# Patient Record
Sex: Female | Born: 2000 | Race: Black or African American | Hispanic: No | Marital: Single | Smoking: Never smoker
Health system: Southern US, Community
[De-identification: ages and names within clinical notes are randomized; demographics above are authoritative.]

## PROBLEM LIST (undated history)

## (undated) DIAGNOSIS — R55 Syncope and collapse: Secondary | ICD-10-CM

## (undated) DIAGNOSIS — F419 Anxiety disorder, unspecified: Secondary | ICD-10-CM

## (undated) DIAGNOSIS — F32A Depression, unspecified: Secondary | ICD-10-CM

## (undated) DIAGNOSIS — R011 Cardiac murmur, unspecified: Secondary | ICD-10-CM

## (undated) DIAGNOSIS — J45909 Unspecified asthma, uncomplicated: Secondary | ICD-10-CM

## (undated) DIAGNOSIS — D509 Iron deficiency anemia, unspecified: Secondary | ICD-10-CM

## (undated) DIAGNOSIS — R569 Unspecified convulsions: Secondary | ICD-10-CM

## (undated) DIAGNOSIS — J189 Pneumonia, unspecified organism: Secondary | ICD-10-CM

## (undated) HISTORY — DX: Iron deficiency anemia, unspecified: D50.9

## (undated) HISTORY — DX: Syncope and collapse: R55

## (undated) HISTORY — DX: Cardiac murmur, unspecified: R01.1

## (undated) HISTORY — DX: Unspecified convulsions: R56.9

## (undated) HISTORY — DX: Pneumonia, unspecified organism: J18.9

## (undated) HISTORY — DX: Unspecified asthma, uncomplicated: J45.909

## (undated) HISTORY — DX: Anxiety disorder, unspecified: F41.9

## (undated) HISTORY — DX: Depression, unspecified: F32.A

---
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
## 2020-03-01 ENCOUNTER — Other Ambulatory Visit: Payer: Self-pay

## 2020-03-01 ENCOUNTER — Ambulatory Visit (HOSPITAL_COMMUNITY)
Admission: EM | Admit: 2020-03-01 | Discharge: 2020-03-01 | Disposition: A | Discharge status: Home / Self Care | Payer: Medicaid Other | Attending: Psychiatry | Admitting: Psychiatry

## 2020-03-01 DIAGNOSIS — F4325 Adjustment disorder with mixed disturbance of emotions and conduct: Secondary | ICD-10-CM | POA: Insufficient documentation

## 2020-03-01 DIAGNOSIS — F4323 Adjustment disorder with mixed anxiety and depressed mood: Secondary | ICD-10-CM | POA: Insufficient documentation

## 2020-03-01 NOTE — ED Provider Notes (Signed)
Behavioral Health Urgent Care Medical Screening Exam  Patient Name: Kristin Ingram MRN: [id number] Date of Evaluation: 03/01/20 Chief Complaint:  Sent by [facility] for suicidal ideations Diagnosis:  Final diagnoses:  Adjustment disorder with mixed disturbance of emotions and conduct    History of Present illness: Kristin Ingram is a 19 y.o.  Kristin Ingram female patient seen in [facility] psych by this provider and chart reviewed on 03/01/20. Patient referred by [facility] after patient expressed suicidal ideations to counselor. On evaluation Kristin Ingram reports increased stress related to school demands and relationship issues. 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 Patient states she is in her first real relationship (same-sex) and finds it difficult to communicate with her partner. Patient uses female pronouns.   During evaluation Kristin Ingram is alert/oriented x 4; calm/cooperative; and mood is congruent with affect.  She does not appear to be responding to internal/external stimuli or delusional thoughts.  Patient denies suicidal/self-harm/homicidal ideation, psychosis, and paranoia.  Patient answered questions appropriately. Pt states she felt "embarrassed" as she didn't expect to be brought "to a place like this". Pt has endorses past depression, anxiety, and ADHD diagnoses; no past history of psychiatric hospitalizations. Patient currently lives at home with mother [name]) whom she states she doesn't have a close relationship with and no relationship with dad. Pt endorses supports as grandmother, friends, and therapists (2). Patient denies any anhedonia; states she hangs out with friends, plays video games, and spends time with family for leisure.  Pt provided verbal consent to call grandmother for collateral, states mother doesn't know about her  lifestyle (same-sex). Grandmother [name]) contacted via phone by provider. Patient's grandmother verbalized she doesn't "believe she will try to hurt herself" and sees herself as an active support person. Grandmother also states patient is very close to her youngest aunt who will also serve as a support person. Grandmother states she "feels she is alright and doesn't need to be in a hospital right now".   On re-evaluation patient verbalized feeling "embarrassed" by being brought in. Patient states "It's only stress, I'm not really going to jump off of a bridge. I know death is permanent and this temporary". Pt continues to deny suicidal or homicidal ideations. Patient denies any audio or visual hallucinations. Denies any illicit substance use. Patient states she sees a therapist once a week at [facility] [name] and "about every 2 weeks" at [employer]) for therapy and medication management. Patient unable to provide phone number due to not having her phone. Patient affect is brighter, pt is actively smiling and engaging with provider. "Ma'am I promise I'm not crazy and do not want to hurt myself. I was just frustrated and said some things but I do not need to be in a place like this. I'm in college and have plans".   Pt approached provider after stating she overheard "someone on the phone with my mom". Pt voiced concerns because her mom wasn't aware of her sexuality. Providers sat down with patient and reassured no information was discussed involving her sexuality. Providers reinforced the need to obtain collateral information for safety purposes only. After debriefing patient expressed appreciation and stating "I feel better now".   Psychiatric Specialty Exam  Presentation  General Appearance:Appropriate for Environment  Eye Contact:Good  Speech:Clear and Coherent  Speech Volume:Normal  Handedness:Right   Mood and Affect  Mood:Euphoric  Affect:Appropriate   Thought  Process  Thought Processes:Goal Directed  Descriptions of Associations:Intact  Orientation:Full (Time, Place and Person)  Thought Content:Logical;WDL  Hallucinations:None  Ideas of Reference:None  Suicidal Thoughts:No  Homicidal Thoughts:No   Sensorium  Memory:Immediate Good;Remote Good;Recent Good  Judgment:Good  Insight:Good   Executive Functions  Concentration:Good  Attention Span:Good  Recall:Good  Fund of Knowledge:Good  Language:Good   Psychomotor Activity  Psychomotor Activity:Normal   Assets  Assets:Communication Skills;Desire for Improvement;Housing;Leisure Time;Physical Health;Resilience;Social Support;Talents/Skills;Vocational/Educational   Sleep  Sleep:Good  Number of hours: 6   Physical Exam: Physical Exam Vitals and nursing note reviewed.  Psychiatric:        Mood and Affect: Mood normal.        Behavior: Behavior normal.        Thought Content: Thought content normal.        Judgment: Judgment normal.    Review of Systems  Psychiatric/Behavioral: Positive for depression.  All other systems reviewed and are negative.  Blood pressure 128/73, pulse 74, temperature 98.9 F (37.2 C), temperature source Oral, resp. rate 20, SpO2 100 %. There is no height or weight on file to calculate BMI.  Musculoskeletal: Strength & Muscle Tone: within normal limits Gait & Station: normal Patient leans: N/A   [facility] MSE Discharge Disposition for Follow up and Recommendations: Based on my evaluation the patient does not appear to have an emergency medical condition and can be discharged with resources and follow up care in outpatient services for Medication Management and Individual Therapy. Pt denies any active suicidal or homicidal ideations. Pt denies any auditory or visual hallucinations or illicit substance use. Pt has outpatient and family supports in place; currently being seen by [name] therapist [name]) and outpatient therapist  via [name], [name] ([address] [place]). Provider spoke with patient's grandmother [name] who states she does not believe patient is a threat to herself or others. Patient's grandmother confirmed patient is currently in active therapy and believes patient is safe for discharge.  Reviewed case with Dr. Nelly Rout. Patient current condition does not warrant inpatient hospitalization at this time.    Loletta Parish, NP 03/01/2020, 5:46 PM

## 2020-03-01 NOTE — Progress Notes (Signed)
[address] 

## 2020-03-01 NOTE — Progress Notes (Signed)
Kristin Ingram was given her AVS, questions answered and escorted to retrieve her personal belongings.

## 2020-03-01 NOTE — Discharge Instructions (Addendum)
Continue all medication as prescribed by your mental healthcare provider. Report any adverse effects and reactions from your medications to your outpatient provider promptly. Do not engage in alcohol and or illegal drug use while on prescription medicines.  Keep all scheduled appointments. This is to ensure that you are getting refills on time and to avoid any interruption in your medication. If you are unable to keep an appointment call and reschedule. Be sure to follow up with resources and follow ups given.  In the event of worsening symptoms call the crisis hotline 911, and or go to the nearest emergency department for appropriate evaluation and treatment of symptoms.  Follow-up with your primary care provider for your medical issues, concerns and or health care needs.

## 2020-03-01 NOTE — BH Assessment (Signed)
Comprehensive Clinical Assessment (CCA) Note  03/01/2020 Kristin Ingram [id number]   Patient states that she has become increasingly depressed and today she had suicidal thoughts.  Instead of acting on them, she states that she tried to contact her therapist at [facility], Leone Payor,, but was unable to connect with her so she went to the [facility] and states that they sent her here.  Patient states that she told them she was feeling suicidal and jumping off a bridge had crossed her mind. Patient states that she is an only child and she lives with her mother and states that they are not close.  Patient states that she is currently in a toxic relationship with a female who does not communicate with her and patient states that she has been codependent on the relationship.  She states that she knows that she needs to get out of it, but it is hard because this is her first love.  Patient states that she has never tried to kill herself in the past, but states that she has a history of cutting in middle school. However, patient states that she has not cut in many years.  Patient states that her sleep is off and she is only sleeping six hours on average per night and states that shee has not been eating very well recently. Patient denies any substance abuse.  Patient presents as oriented and alert.  Her eye contact was avoided.  Patient appears to be depressed and has a flat affect.  Her judgment, insight and impulse control appear to be mostly intact.  Her thoughts are organized and hermemory intact.  She does not appear to be responding to any internal stimuili.  TTS spoke to patient's mother, [name] [phone], who states that patient does not communicate with her and she states that she does not know anything that is going on in her life.  She states that patient isolates in her room and does not do much to help around the house.  Mother states that patient has cut  herself in the past, but states that she has never made any true suicide attempts in the past.  Mother states that she cannot offer any real suggestions that might help with her daughter because she does not know what is going on in her life.   Visit Diagnosis:      ICD-10-CM   1. Adjustment disorder with mixed disturbance of emotions and conduct  F43.25       CCA Screening, Triage and Referral (STR)  Patient Reported Information How did you hear about [place]? School/University  Referral name: [facility]  Referral phone number: No data recorded  Whom do you see for routine medical problems? Primary Care  Practice/Facility Name: [facility]  Practice/Facility Phone Number: No data recorded Name of Contact: No data recorded Contact Number: No data recorded Contact Fax Number: No data recorded Prescriber Name: No data recorded Prescriber Address (if known): No data recorded  What Is the Reason for Your Visit/Call Today? Patient is depressed, passive suicidal thoughts.  Patient states that she is in a toxic relationship that she knows that she needs to end, but states that she is too codependent  How Long Has This Been Causing You Problems? 1 wk - 1 month  What Do You Feel Would Help You the Most Today? Other (Comment) (more intensive therapy)   Have You Recently Been in Any Inpatient Treatment (Hospital/Detox/Crisis Center/28-Day Program)? No  Name/Location of Program/Hospital:No  data recorded How Long Were You There? No data recorded When Were You Discharged? No data recorded  Have You Ever Received Services From [facility] Before? No  Who Do You See at [facility]? No data recorded  Have You Recently Had Any Thoughts About Hurting Yourself? Yes  Are You Planning to Commit Suicide/Harm Yourself At This time? No   Have you Recently Had Thoughts About Hurting Someone Karolee Ohs? No  Explanation: No data recorded  Have You Used Any Alcohol or Drugs in  the Past 24 Hours? No  How Long Ago Did You Use Drugs or Alcohol? No data recorded What Did You Use and How Much? No data recorded  Do You Currently Have a Therapist/Psychiatrist? Yes  Name of Therapist/Psychiatrist: [facility].  Patient states that she sees a therapist and a psychiatrist at [facility].  She states that they were not available today so she went to the campus counseling center.   Have You Been Recently Discharged From Any Office Practice or Programs? No  Explanation of Discharge From Practice/Program: No data recorded    CCA Screening Triage Referral Assessment Type of Contact: Face-to-Face  Is this Initial or Reassessment? No data recorded Date Telepsych consult ordered in [facility]:  No data recorded Time Telepsych consult ordered in [facility]:  No data recorded  Patient Reported Information Reviewed? Yes  Patient Left Without Being Seen? No data recorded Reason for Not Completing Assessment: No data recorded  Collateral Involvement: No data recorded  Does Patient Have a Court Appointed Legal Guardian? No data recorded Name and Contact of Legal Guardian: No data recorded If Minor and Not Living with Parent(s), Who has Custody? No data recorded Is CPS involved or ever been involved? Never  Is APS involved or ever been involved? Never   Patient Determined To Be At Risk for Harm To Self or Others Based on Review of Patient Reported Information or Presenting Complaint? Yes, for Self-Harm (no plan or intent expressed)  Method: No data recorded Availability of Means: No data recorded Intent: No data recorded Notification Required: No data recorded Additional Information for Danger to Others Potential: No data recorded Additional Comments for Danger to Others Potential: No data recorded Are There Guns or Other Weapons in Your Home? No data recorded Types of Guns/Weapons: No data recorded Are These Weapons Safely Secured?                            No data recorded Who Could  Verify You Are Able To Have These Secured: No data recorded Do You Have any Outstanding Charges, Pending Court Dates, Parole/Probation? No data recorded Contacted To Inform of Risk of Harm To Self or Others: Family/Significant Other: (mother and grandmother aware)   Location of Assessment: [facility] Services   Does Patient Present under Involuntary Commitment? No  IVC Papers Initial File Date: No data recorded  [place] of Residence: [place]   Patient Currently Receiving the Following Services: Medication Management;Individual Therapy   Determination of Need: Urgent (48 hours)   Options For Referral: Inpatient Hospitalization;Medication Management;Outpatient Therapy     CCA Biopsychosocial  Intake/Chief Complaint:  CCA Intake With Chief Complaint CCA Part Two Date: 03/01/20 CCA Part Two Time: 1718 Chief Complaint/Presenting Problem: Patient states that she has become increasingly depressed and today she had suicidal thoughts.  Instead of acting on them, she states that she tried to contact her therapist at [facility], Leone Payor,, but was unable to connect with  her so she [facility] and states that they sent her here.  Patient states that she told them she was feeling suicidal and jumping off a bridge had crossed her mind. Patient states that she is an only child and she lives with her mother and states that they are not close.  Patient states that she is currently in a toxic relationship with a female who does not communicate with her and patient states that she has been codependent on the relationship.  She states that she knows that she needs to get out of it, but it is hard because this is her first love.  Patient states that she has never tried to kill herself in the past, but states that she has a history of cutting in middle school. However, patient states that she has not cut in many years.  Patient states that her sleep is off and she is  only sleeping six hours on average per night and states that shee has not been eating very well recently. Patient denies any substance abuse. Patient's Currently Reported Symptoms/Problems: Patient is experiencing a depressed mood, anxiety and has a flat affect. Individual's Strengths: Patient states that she is creative, intelligent, funny and easy going Individual's Preferences: patient has no preferences that require accommodation Individual's Abilities: Patient states that she is good at drawing Type of Services Patient Feels Are Needed: Patient states that she could benefit from more intensive services  Mental Health Symptoms Depression:     Mania:     Anxiety:      Psychosis:     Trauma:     Obsessions:     Compulsions:     Inattention:     Hyperactivity/Impulsivity:     Oppositional/Defiant Behaviors:     Emotional Irregularity:     Other Mood/Personality Symptoms:      Mental Status Exam Appearance and self-care  Stature:  Stature: Tall  Weight:  Weight: Thin  Clothing:  Clothing: Neat/clean  Grooming:  Grooming: Normal  Cosmetic use:  Cosmetic Use: Age appropriate  Posture/gait:  Posture/Gait: Normal  Motor activity:  Motor Activity: Restless  Sensorium  Attention:  Attention: Normal  Concentration:  Concentration: Normal  Orientation:  Orientation: Object, Person, Place, Situation, Time  Recall/memory:  Recall/Memory: Normal  Affect and Mood  Affect:  Affect: Depressed, Flat  Mood:  Mood: Depressed, Anxious  Relating  Eye contact:  Eye Contact: Avoided  Facial expression:  Facial Expression: Depressed, Anxious  Attitude toward examiner:  Attitude Toward Examiner: Cooperative  Thought and Language  Speech flow: Speech Flow: Clear and Coherent, Normal  Thought content:  Thought Content: Appropriate to Mood and Circumstances  Preoccupation:  Preoccupations: None  Hallucinations:  Hallucinations: None  Organization:     Company secretaryxecutive Functions  Fund of Knowledge:   Fund of Knowledge: Good  Intelligence:  Intelligence: Above Average  Abstraction:  Abstraction: Normal  Judgement:  Judgement: Fair  Dance movement psychotherapisteality Testing:  Reality Testing: Realistic  Insight:  Insight: Fair  Decision Making:  Decision Making: Impulsive  Social Functioning  Social Maturity:  Social Maturity: Isolates  Social Judgement:  Social Judgement: Normal  Stress  Stressors:  Stressors: Family conflict, Relationship, School  Coping Ability:  Coping Ability: Building surveyorverwhelmed  Skill Deficits:  Skill Deficits: None  Supports:  Supports: Family     Religion: Religion/Spirituality Are You A Religious Person?: No  Leisure/Recreation: Leisure / Recreation Do You Have Hobbies?: Yes Leisure and Hobbies: video games and spending time with friends  Exercise/Diet: Exercise/Diet Do You Exercise?: No Have You Gained or Lost A Significant Amount of Weight in the Past Six Months?: No Do You Follow a Special Diet?: No Do You Have Any Trouble Sleeping?: Yes Explanation of Sleeping Difficulties: wakes up a lot, sleeps six hours on average   CCA Employment/Education  Employment/Work Situation: Employment / Work Situation Employment situation: Surveyor, minerals job has been impacted by current illness: No What is the longest time patient has a held a job?: N/A Where was the patient employed at that time?: N/A Has patient ever been in the [employer]?: No  Education: Education Is Patient Currently Attending School?: Yes School Currently Attending: [employer] Last Grade Completed: 12 Name of [employer] School: [place] Did Garment/textile technologist From [employer]?: Yes Did Theme park manager?: Yes What Type of College Degree Do you Have?: Pursuing a nursing degree Did You Attend Graduate School?: No Did You Have An Individualized Education Program (IIEP): No Did You Have Any Difficulty At School?: No Patient's Education Has Been Impacted by Current Illness: No   CCA Family/Childhood History  Family and  Relationship History: Family history Marital status: Single Are you sexually active?: Yes What is your sexual orientation?: homosexual Has your sexual activity been affected by drugs, alcohol, medication, or emotional stress?: none reported Does patient have children?: No  Childhood History:  Childhood History By whom was/is the patient raised?: Mother Additional childhood history information: patient states that she has no relationship with her father and that he lives in [place] Description of patient's relationship with caregiver when they were a child: Patient states that she knows her mother loves her, but they do not communicate well Patient's description of current relationship with people who raised him/her: Patient states that she isolates herself with her mother How were you disciplined when you got in trouble as a child/adolescent?: Patient states thats he has been disciplined appropriately Does patient have siblings?: No Did patient suffer any verbal/emotional/physical/sexual abuse as a child?: No Did patient suffer from severe childhood neglect?: No Has patient ever been sexually abused/assaulted/raped as an adolescent or adult?: No Witnessed domestic violence?: No Has patient been affected by domestic violence as an adult?: No  Child/Adolescent Assessment:     CCA Substance Use  Alcohol/Drug Use:                           ASAM's:  Six Dimensions of Multidimensional Assessment  Dimension 1:  Acute Intoxication and/or Withdrawal Potential:      Dimension 2:  Biomedical Conditions and Complications:      Dimension 3:  Emotional, Behavioral, or Cognitive Conditions and Complications:     Dimension 4:  Readiness to Change:     Dimension 5:  Relapse, Continued use, or Continued Problem Potential:     Dimension 6:  Recovery/Living Environment:     ASAM Severity Score:    ASAM Recommended Level of Treatment:     Substance use Disorder (SUD)     Recommendations for Services/Supports/Treatments:    DSM5 Diagnoses: Patient Active Problem List   Diagnosis Date Noted  . Adjustment disorder with mixed anxiety and depressed mood   . Migraine without aura and without status migrainosus, not intractable 12/26/2015  . Tension headache 12/26/2015  . Vasovagal syncope 12/26/2015    Disposition:  Per Berneice Heinrich, NP, patient can be discharged home to follow-up with her OP [facility]   Referrals to Alternative Service(s): Referred to Alternative Service(s):   Place:  Date:   Time:    Referred to Alternative Service(s):   Place:   Date:   Time:    Referred to Alternative Service(s):   Place:   Date:   Time:    Referred to Alternative Service(s):   Place:   Date:   Time:     Shyler Holzman J Rockford Leinen

## 2021-03-23 ENCOUNTER — Emergency Department (HOSPITAL_COMMUNITY)
Admission: EM | Admit: 2021-03-23 | Discharge: 2021-03-24 | Disposition: A | Discharge status: Home / Self Care | Payer: Medicaid Other | Attending: Emergency Medicine | Admitting: Emergency Medicine

## 2021-03-23 ENCOUNTER — Other Ambulatory Visit: Payer: Self-pay

## 2021-03-23 ENCOUNTER — Encounter (HOSPITAL_COMMUNITY): Payer: Self-pay

## 2021-03-23 DIAGNOSIS — R1011 Right upper quadrant pain: Secondary | ICD-10-CM | POA: Diagnosis present

## 2021-03-23 DIAGNOSIS — Z20822 Contact with and (suspected) exposure to covid-19: Secondary | ICD-10-CM | POA: Diagnosis not present

## 2021-03-23 DIAGNOSIS — R109 Unspecified abdominal pain: Secondary | ICD-10-CM

## 2021-03-23 DIAGNOSIS — Z880 Allergy status to penicillin: Secondary | ICD-10-CM | POA: Diagnosis not present

## 2021-03-23 DIAGNOSIS — N9489 Other specified conditions associated with female genital organs and menstrual cycle: Secondary | ICD-10-CM | POA: Insufficient documentation

## 2021-03-23 NOTE — ED Triage Notes (Signed)
Pt BIB EMS from home. Pt complains of severe abdominal pain x 2 weeks to the RUQ.

## 2021-03-23 NOTE — ED Notes (Signed)
Labeled specimen cup provided to pt for urine collection per MD order. ENMiles 

## 2021-03-24 ENCOUNTER — Emergency Department (HOSPITAL_COMMUNITY)
Admission: EM | Admit: 2021-03-24 | Discharge: 2021-03-24 | Disposition: A | Discharge status: Home / Self Care | Payer: Medicaid Other | Source: Home / Self Care | Attending: Emergency Medicine | Admitting: Emergency Medicine

## 2021-03-24 ENCOUNTER — Emergency Department (HOSPITAL_COMMUNITY): Payer: Medicaid Other

## 2021-03-24 ENCOUNTER — Other Ambulatory Visit: Payer: Self-pay

## 2021-03-24 ENCOUNTER — Encounter (HOSPITAL_COMMUNITY): Payer: Self-pay | Admitting: Emergency Medicine

## 2021-03-24 DIAGNOSIS — R197 Diarrhea, unspecified: Secondary | ICD-10-CM | POA: Insufficient documentation

## 2021-03-24 DIAGNOSIS — R262 Difficulty in walking, not elsewhere classified: Secondary | ICD-10-CM

## 2021-03-24 DIAGNOSIS — R531 Weakness: Secondary | ICD-10-CM | POA: Insufficient documentation

## 2021-03-24 DIAGNOSIS — R1011 Right upper quadrant pain: Secondary | ICD-10-CM | POA: Insufficient documentation

## 2021-03-24 DIAGNOSIS — Z20822 Contact with and (suspected) exposure to covid-19: Secondary | ICD-10-CM | POA: Insufficient documentation

## 2021-03-24 LAB — CBC WITH DIFFERENTIAL/PLATELET
Abs Immature Granulocytes: 0.01 10*3/uL (ref 0.00–0.07)
Abs Immature Granulocytes: 0.01 10*3/uL (ref 0.00–0.07)
Basophils Absolute: 0 10*3/uL (ref 0.0–0.1)
Basophils Absolute: 0 10*3/uL (ref 0.0–0.1)
Basophils Relative: 1 %
Basophils Relative: 1 %
Eosinophils Absolute: 0 10*3/uL (ref 0.0–0.5)
Eosinophils Absolute: 0.1 10*3/uL (ref 0.0–0.5)
Eosinophils Relative: 0 %
Eosinophils Relative: 1 %
HCT: 34.1 % — ABNORMAL LOW (ref 36.0–46.0)
HCT: 35.1 % — ABNORMAL LOW (ref 36.0–46.0)
Hemoglobin: 11.7 g/dL — ABNORMAL LOW (ref 12.0–15.0)
Hemoglobin: 12 g/dL (ref 12.0–15.0)
Immature Granulocytes: 0 %
Immature Granulocytes: 0 %
Lymphocytes Relative: 41 %
Lymphocytes Relative: 56 %
Lymphs Abs: 2.3 10*3/uL (ref 0.7–4.0)
Lymphs Abs: 4 10*3/uL (ref 0.7–4.0)
MCH: 28.9 pg (ref 26.0–34.0)
MCH: 29.3 pg (ref 26.0–34.0)
MCHC: 34.2 g/dL (ref 30.0–36.0)
MCHC: 34.3 g/dL (ref 30.0–36.0)
MCV: 84.6 fL (ref 80.0–100.0)
MCV: 85.3 fL (ref 80.0–100.0)
Monocytes Absolute: 0.5 10*3/uL (ref 0.1–1.0)
Monocytes Absolute: 0.7 10*3/uL (ref 0.1–1.0)
Monocytes Relative: 11 %
Monocytes Relative: 9 %
Neutro Abs: 2.2 10*3/uL (ref 1.7–7.7)
Neutro Abs: 2.7 10*3/uL (ref 1.7–7.7)
Neutrophils Relative %: 31 %
Neutrophils Relative %: 49 %
Platelets: 236 10*3/uL (ref 150–400)
Platelets: 248 10*3/uL (ref 150–400)
RBC: 4 MIL/uL (ref 3.87–5.11)
RBC: 4.15 MIL/uL (ref 3.87–5.11)
RDW: 14.3 % (ref 11.5–15.5)
RDW: 14.3 % (ref 11.5–15.5)
WBC: 5.5 10*3/uL (ref 4.0–10.5)
WBC: 7 10*3/uL (ref 4.0–10.5)
nRBC: 0 % (ref 0.0–0.2)
nRBC: 0 % (ref 0.0–0.2)

## 2021-03-24 LAB — URINALYSIS, ROUTINE W REFLEX MICROSCOPIC
Bilirubin Urine: NEGATIVE
Glucose, UA: NEGATIVE mg/dL
Hgb urine dipstick: NEGATIVE
Ketones, ur: 20 mg/dL — AB
Leukocytes,Ua: NEGATIVE
Nitrite: NEGATIVE
Protein, ur: 30 mg/dL — AB
Specific Gravity, Urine: 1.026 (ref 1.005–1.030)
pH: 7 (ref 5.0–8.0)

## 2021-03-24 LAB — I-STAT BETA HCG BLOOD, ED (MC, WL, AP ONLY): I-stat hCG, quantitative: <5 m[IU]/mL (ref <5)

## 2021-03-24 LAB — COMPREHENSIVE METABOLIC PANEL
ALT: 12 U/L (ref 0–44)
ALT: 14 U/L (ref 0–44)
AST: 23 U/L (ref 15–41)
AST: 25 U/L (ref 15–41)
Albumin: 4.6 g/dL (ref 3.5–5.0)
Albumin: 4.8 g/dL (ref 3.5–5.0)
Alkaline Phosphatase: 38 U/L (ref 38–126)
Alkaline Phosphatase: 45 U/L (ref 38–126)
Anion gap: 11 (ref 5–15)
Anion gap: 12 (ref 5–15)
BUN: 12 mg/dL (ref 6–20)
BUN: 12 mg/dL (ref 6–20)
CO2: 21 mmol/L — ABNORMAL LOW (ref 22–32)
CO2: 22 mmol/L (ref 22–32)
Calcium: 9.6 mg/dL (ref 8.9–10.3)
Calcium: 9.9 mg/dL (ref 8.9–10.3)
Chloride: 103 mmol/L (ref 98–111)
Chloride: 107 mmol/L (ref 98–111)
Creatinine, Ser: 0.64 mg/dL (ref 0.44–1.00)
Creatinine, Ser: 0.89 mg/dL (ref 0.44–1.00)
GFR, Estimated: >60 mL/min (ref >60)
GFR, Estimated: >60 mL/min (ref >60)
Glucose, Bld: 85 mg/dL (ref 70–99)
Glucose, Bld: 91 mg/dL (ref 70–99)
Potassium: 3.3 mmol/L — ABNORMAL LOW (ref 3.5–5.1)
Potassium: 4.7 mmol/L (ref 3.5–5.1)
Sodium: 136 mmol/L (ref 135–145)
Sodium: 140 mmol/L (ref 135–145)
Total Bilirubin: 1.3 mg/dL — ABNORMAL HIGH (ref 0.3–1.2)
Total Bilirubin: 1.4 mg/dL — ABNORMAL HIGH (ref 0.3–1.2)
Total Protein: 7.7 g/dL (ref 6.5–8.1)
Total Protein: 8 g/dL (ref 6.5–8.1)

## 2021-03-24 LAB — LIPASE, BLOOD
Lipase: 26 U/L (ref 11–51)
Lipase: 29 U/L (ref 11–51)

## 2021-03-24 LAB — RESP PANEL BY RT-PCR (FLU A&B, COVID) ARPGX2
Influenza A by PCR: NEGATIVE
Influenza B by PCR: NEGATIVE
SARS Coronavirus 2 by RT PCR: NEGATIVE

## 2021-03-24 LAB — TROPONIN I (HIGH SENSITIVITY): Troponin I (High Sensitivity): 4 ng/L (ref <18)

## 2021-03-24 IMAGING — MR MR THORACIC SPINE WO/W CM
11 of 23 series · 19 of 48 positions shown · IV contrast (gadavist)
Comparison: None.

CLINICAL DATA: Initial evaluation for possible multiple sclerosis,
ataxia.

EXAM:
MRI THORACIC WITHOUT AND WITH CONTRAST
TECHNIQUE: Multiplanar and multiecho pulse sequences of the thoracic spine were
obtained without and with intravenous contrast.
CONTRAST:  6mL GADAVIST GADOBUTROL 1 MMOL/ML IV SOLN

[Series 16: T1 · sagittal · 5.0mm · 1.46mm/px · 1 of 9 slices shown (1 of 5)]
[im 1/9]
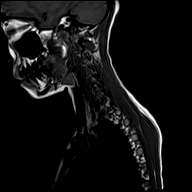

[Series 17: T1 · sagittal · 5.0mm · 1.23mm/px · 1 of 9 slices shown (2 of 5)]
[im 1/9]
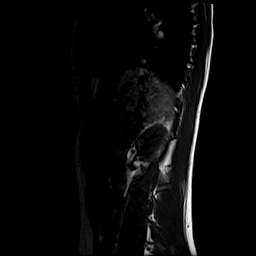

[Series 18: T1 · sagittal · 6.0mm · 1.23mm/px · 1 of 9 slices shown (3 of 5)]
[im 1/9]
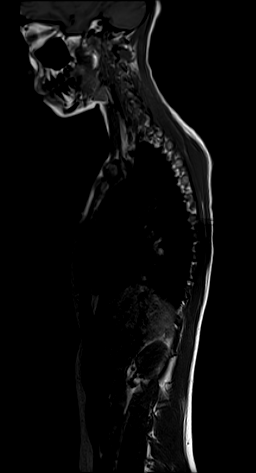

[Series 19: T2 · sagittal · 3.0mm · 0.78mm/px · 1 of 19 slices shown (1 of 2)]
[im 1/19]
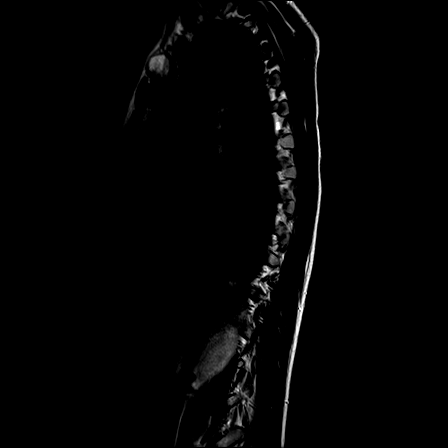

[Series 20: T1 · sagittal · 3.0mm · 0.78mm/px · 1 of 19 slices shown (4 of 5)]
[im 1/19]
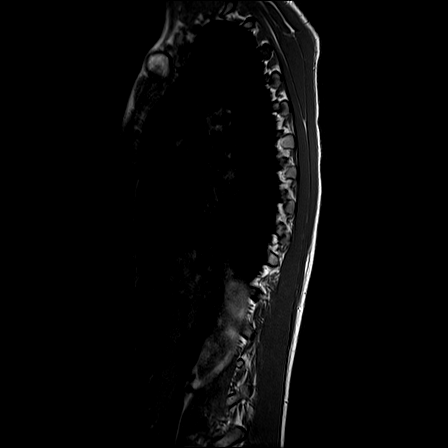

[Series 21: STIR · sagittal · 3.0mm · 0.39mm/px · 1 of 19 slices shown]
[im 1/19]
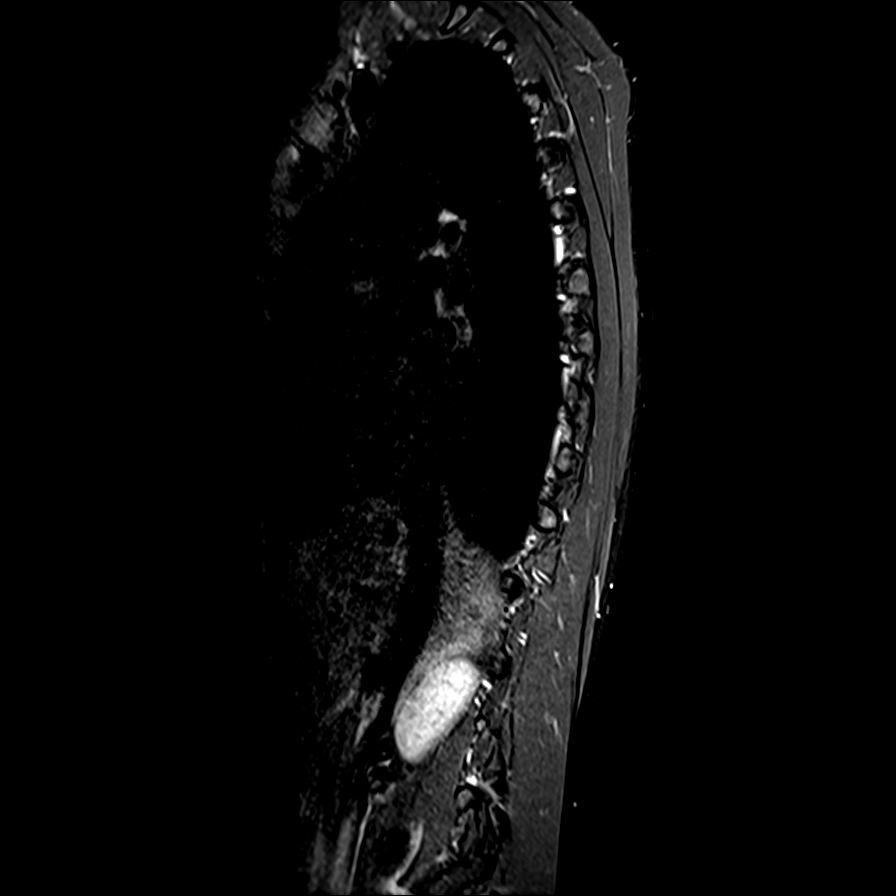

[Series 22: T2 · axial · 4.0mm · 0.59mm/px · z∈[-369,-60]mm · 3 of 52 slices shown (2 of 2)]
[im 1/52]
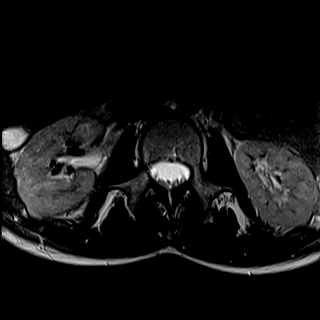
[im 26/52]
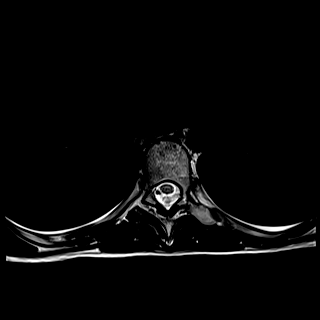
[im 52/52]
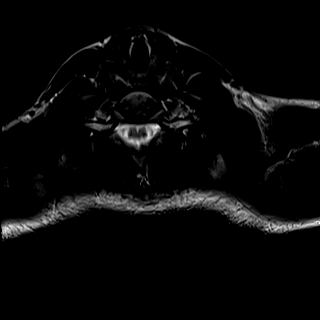

[Series 23: GRE · axial · 4.0mm · 0.37mm/px · z∈[-369,-60]mm · 3 of 52 slices shown]
[im 1/52]
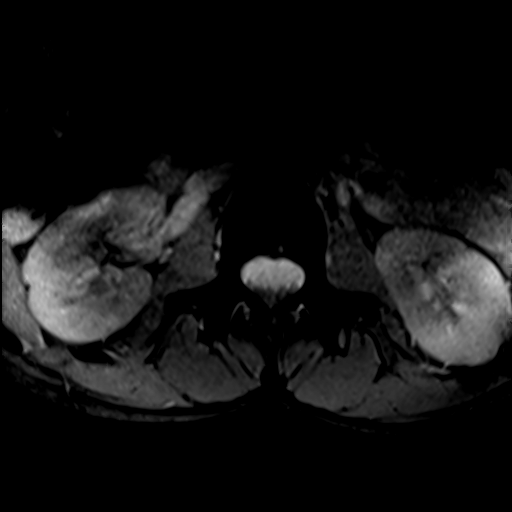
[im 26/52]
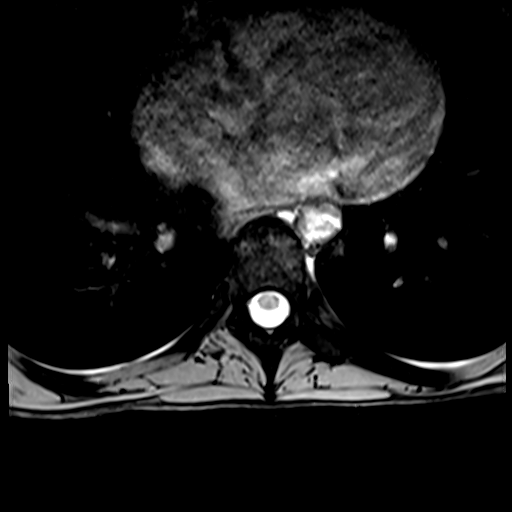
[im 52/52]
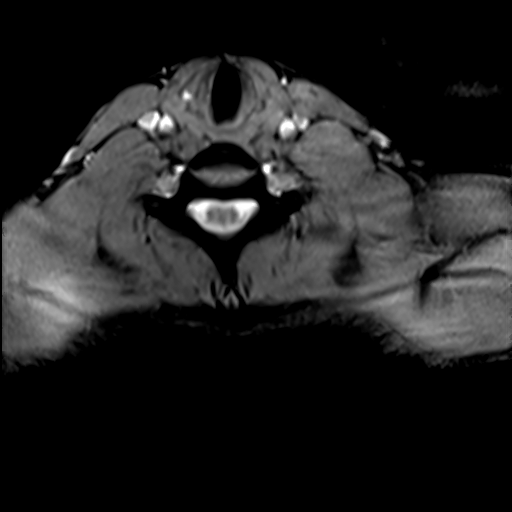

[Series 24: T1 · axial · non-contrast · 4.0mm · 0.31mm/px · z∈[-365,-60]mm · 3 of 52 slices shown (5 of 5)]
[im 1/52]
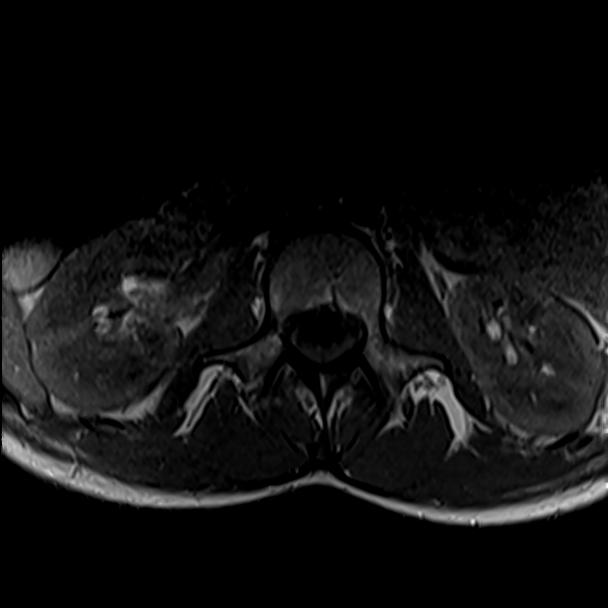
[im 26/52]
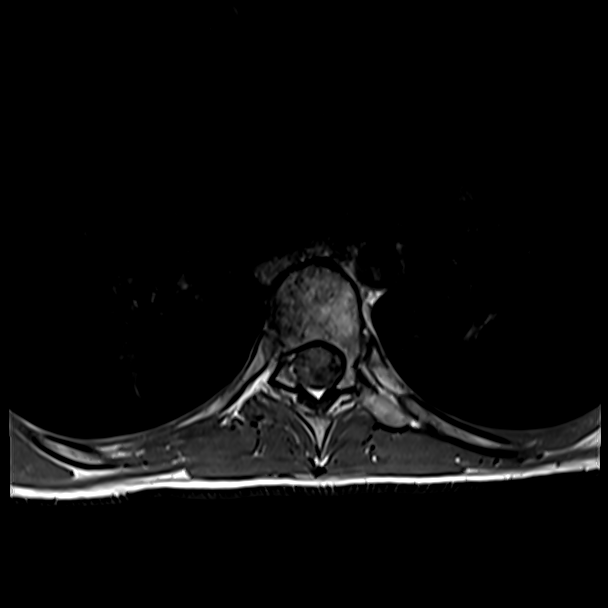
[im 52/52]
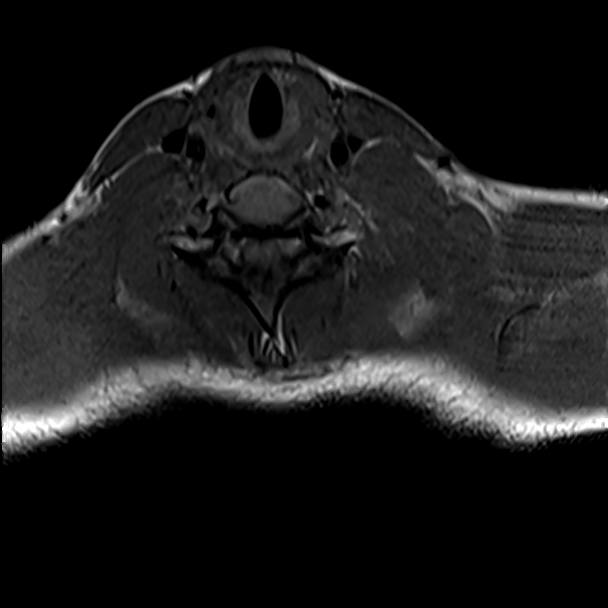

[Series 25: T1 post-contrast · axial · 4.0mm · 0.31mm/px · z∈[-369,-60]mm · 3 of 52 slices shown]
[im 1/52]
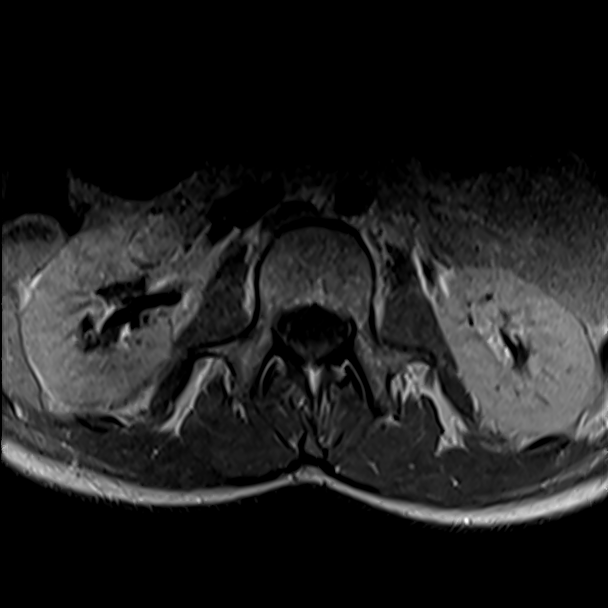
[im 26/52]
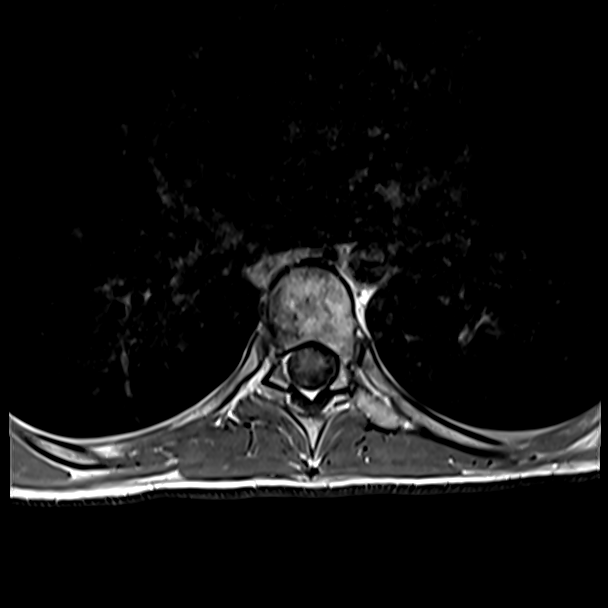
[im 52/52]
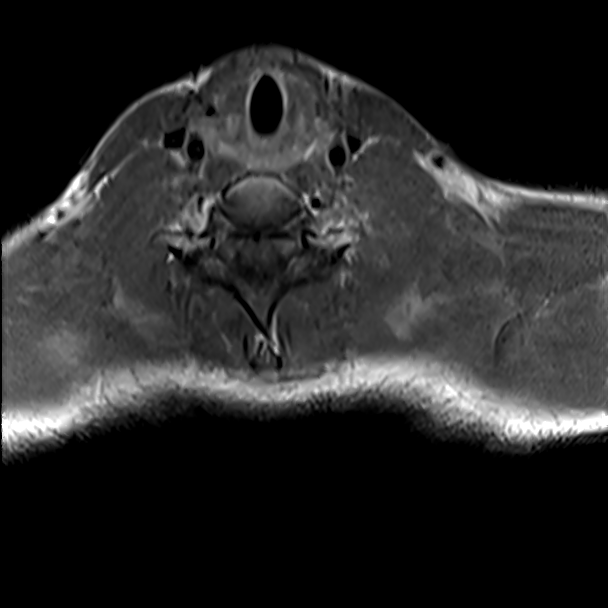

[Series 26: T1 fat-sat post-contrast · sagittal · 3.0mm · 0.76mm/px · 1 of 19 slices shown]
[im 1/19]
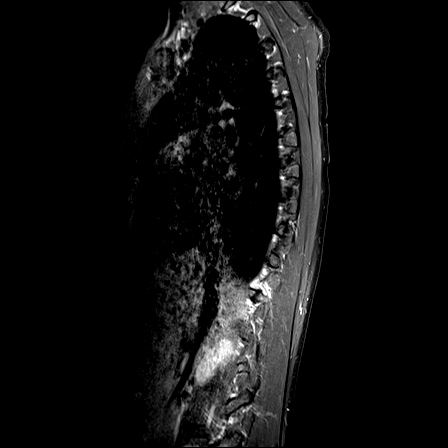

[19 of 48 positions shown; findings below may reference images not displayed]

FINDINGS: Alignment: Physiologic with preservation of the normal thoracic
kyphosis. No listhesis.

Vertebrae: Vertebral body height maintained without acute or chronic
fracture. Bone marrow signal intensity within normal limits. No
discrete or worrisome osseous lesions. No abnormal marrow edema or
enhancement.

Cord: Normal signal and morphology. No signal changes to suggest
demyelinating disease. No abnormal enhancement.

Paraspinal and other soft tissues: Unremarkable.

Disc levels:

T8-9: Disc desiccation with small right paracentral disc protrusion
minimally indents the ventral thecal sac (series 23, image 29). No
significant spinal stenosis.

No other significant disc pathology seen within the thoracic spine.
No other stenosis or neural impingement.
IMPRESSION: 1. Normal MRI appearance of the thoracic spinal cord. No evidence
for demyelinating disease.
2. Small right paracentral disc protrusion at T8-9 without stenosis.

## 2021-03-24 IMAGING — MR MR CERVICAL SPINE WO/W CM
14 of 21 series · 25 of 48 positions shown · IV contrast (gadavist)
Comparison: None.

CLINICAL DATA: Initial evaluation for possible multiple sclerosis,
new event.

EXAM:
MRI CERVICAL SPINE WITHOUT AND WITH CONTRAST
TECHNIQUE: Multiplanar and multiecho pulse sequences of the cervical spine, to
include the craniocervical junction and cervicothoracic junction,
were obtained without and with intravenous contrast.
CONTRAST:  6mL GADAVIST GADOBUTROL 1 MMOL/ML IV SOLN

[Series 9: T2 · sagittal · 3.0mm · 0.69mm/px · 1 of 15 slices shown (1 of 2)]
[im 1/15]
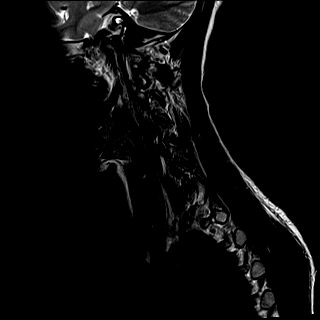

[Series 10: T1 · sagittal · 3.0mm · 0.69mm/px · 1 of 15 slices shown (1 of 8)]
[im 1/15]
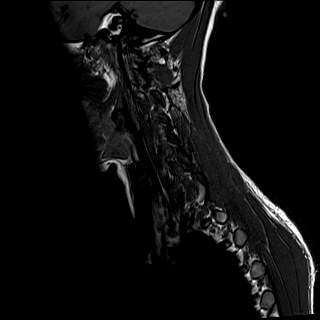

[Series 11: STIR · sagittal · 3.0mm · 0.86mm/px · 1 of 18 slices shown]
[im 1/18]
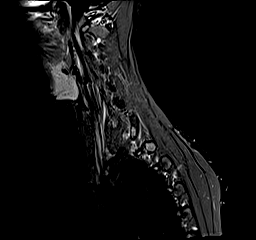

[Series 12: T2 · axial · 3.0mm · 0.66mm/px · z∈[-121,+6]mm · 4 of 43 slices shown (2 of 2)]
[im 1/43]
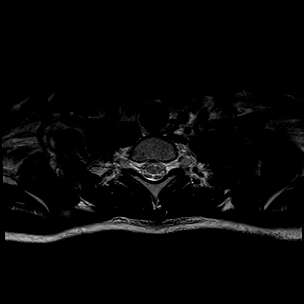
[im 15/43]
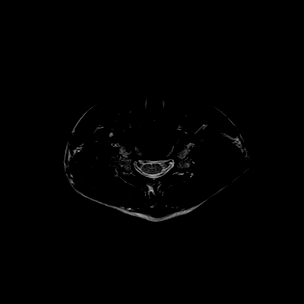
[im 29/43]
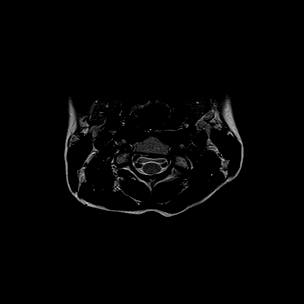
[im 43/43]
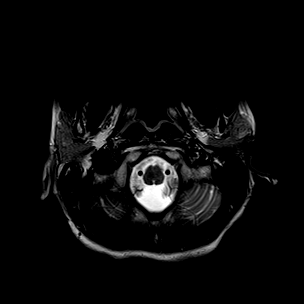

[Series 13: GRE · axial · 3.0mm · 0.39mm/px · z∈[-121,+6]mm · 4 of 43 slices shown]
[im 1/43]
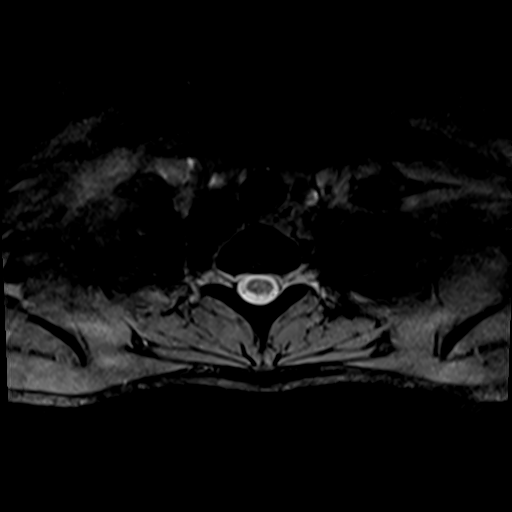
[im 15/43]
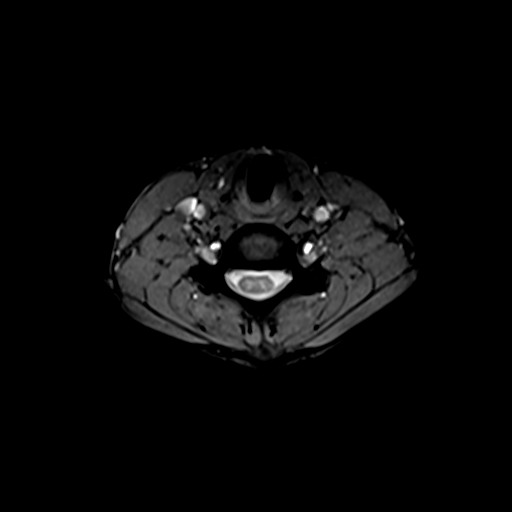
[im 29/43]
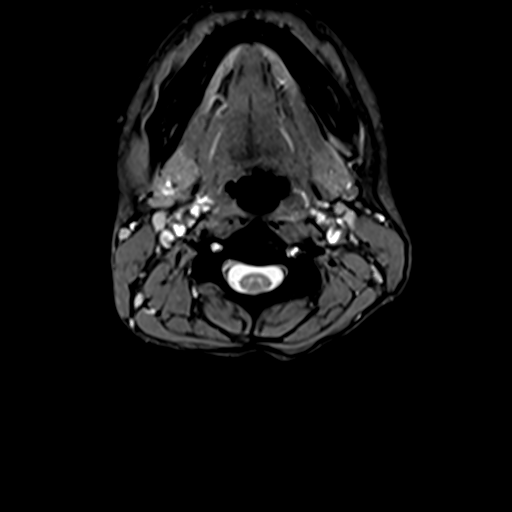
[im 43/43]
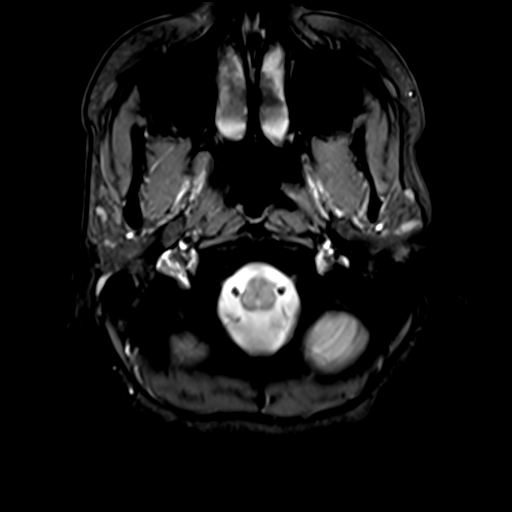

[Series 14: T1 · axial · 3.0mm · 0.39mm/px · z∈[-117,+13]mm · 4 of 43 slices shown (2 of 8)]
[im 1/43]
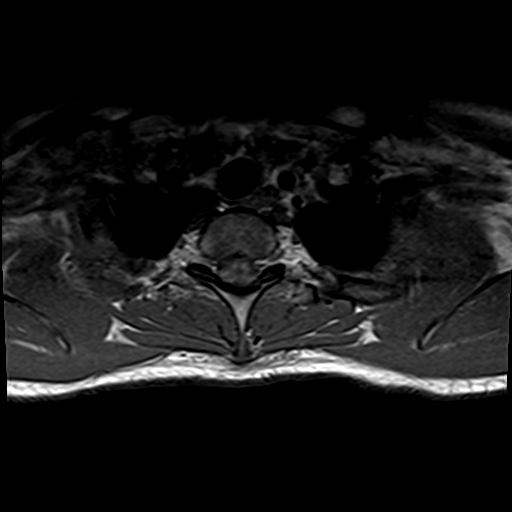
[im 15/43]
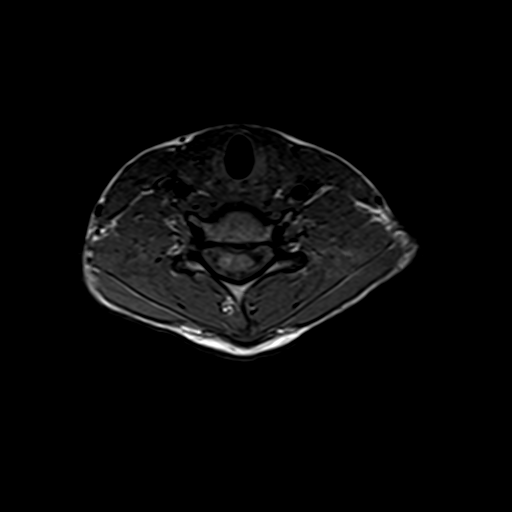
[im 29/43]
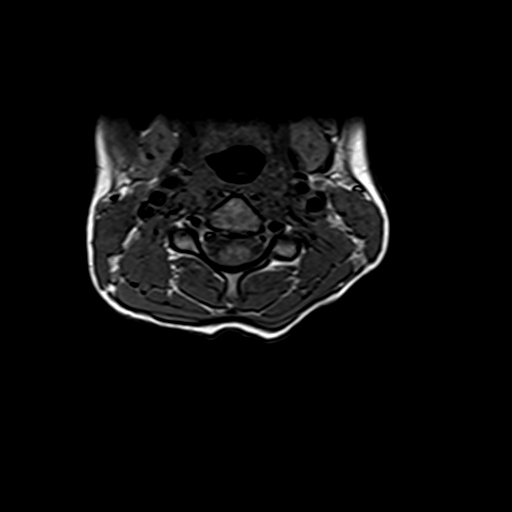
[im 43/43]
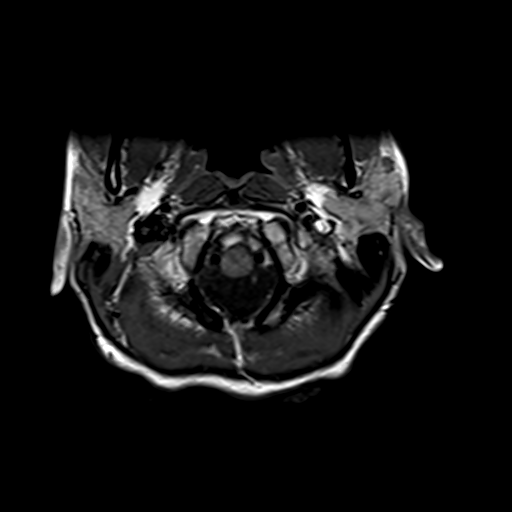

[Series 15: T1 · sagittal · 5.0mm · 1.46mm/px · 1 of 9 slices shown (3 of 8)]
[im 1/9]
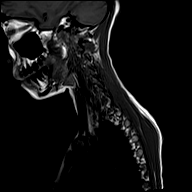

[Series 16: T1 · sagittal · 5.0mm · 1.23mm/px · 1 of 9 slices shown (4 of 8)]
[im 1/9]
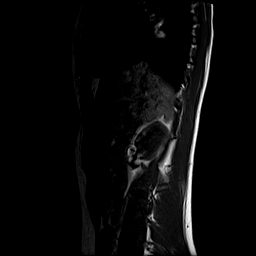

[Series 17: T1 · sagittal · 6.0mm · 1.23mm/px · 1 of 9 slices shown (5 of 8)]
[im 1/9]
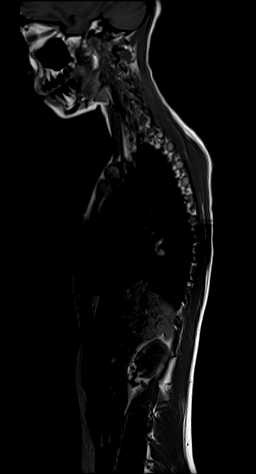

[Series 18: T1 · sagittal · 5.0mm · 1.46mm/px · 1 of 13 slices shown (6 of 8)]
[im 1/13]
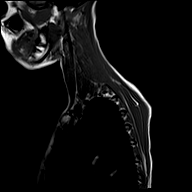

[Series 19: T1 · sagittal · 5.0mm · 1.29mm/px · 1 of 13 slices shown (7 of 8)]
[im 1/13]
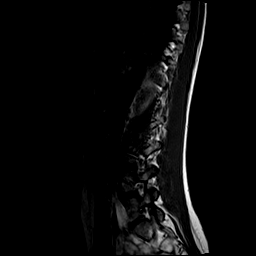

[Series 20: T1 · 1 of 9 slices shown (8 of 8)]
[im 1/9]
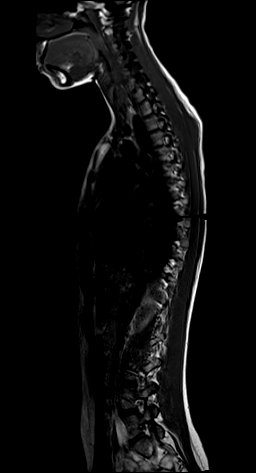

[Series 21: T1 fat-sat post-contrast · sagittal · 3.0mm · 0.43mm/px · 1 of 15 slices shown]
[im 1/15]
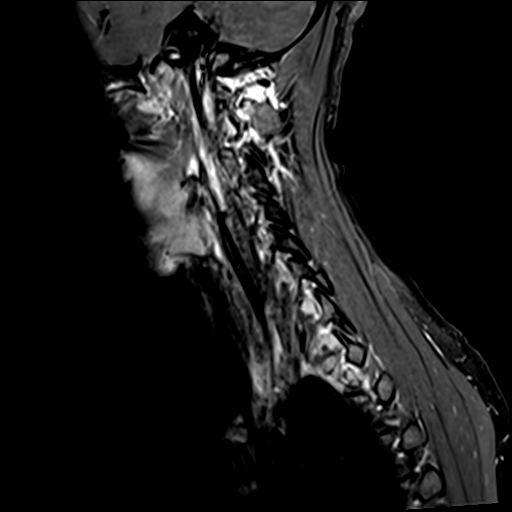

[Series 22: T1 post-contrast · axial · 3.0mm · 0.39mm/px · z∈[-117,+1]mm · 3 of 40 slices shown]
[im 1/40]
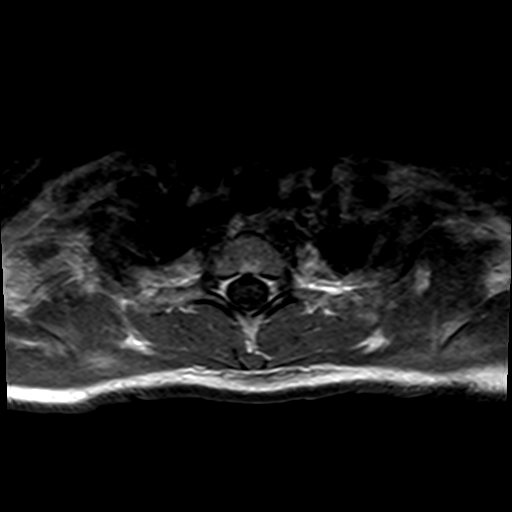
[im 20/40]
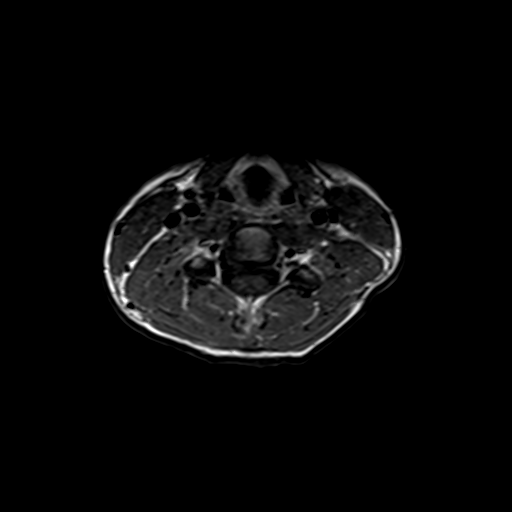
[im 40/40]
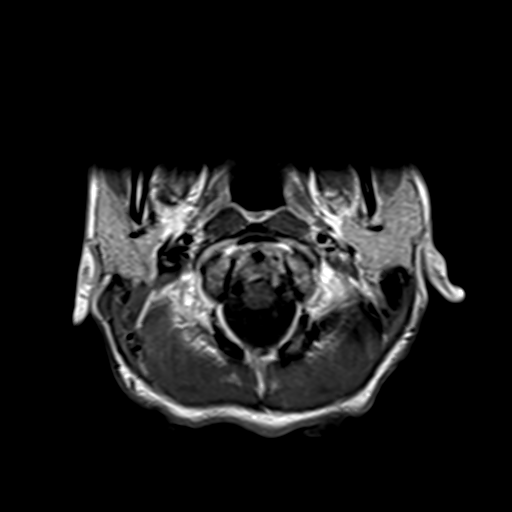

[25 of 48 positions shown; findings below may reference images not displayed]

FINDINGS: Alignment: Straightening of the normal cervical lordosis. No
listhesis.

Vertebrae: Vertebral body height maintained without acute or chronic
fracture. Bone marrow signal intensity within normal limits. No
discrete or worrisome osseous lesions. No abnormal marrow edema or
enhancement.

Cord: Normal signal morphology. No findings to suggest demyelinating
disease. No abnormal enhancement. Normal cord caliber and
morphology.

Posterior Fossa, vertebral arteries, paraspinal tissues:
Unremarkable.

Disc levels:

Unremarkable. No significant disc pathology or facet degeneration.
No canal or neural foraminal stenosis or evidence for neural
impingement.
IMPRESSION: Normal MRI of the cervical spine and spinal cord. No evidence for
demyelinating disease or other abnormality.

## 2021-03-24 IMAGING — MR MR HEAD WO/W CM
14 of 20 series · 33 of 48 positions shown · IV contrast (gadavist)
Comparison: None.

CLINICAL DATA: Initial evaluation for possible new onset and mass,
family history of a mass. Ataxia with bilateral leg weakness.

EXAM:
MRI HEAD WITHOUT AND WITH CONTRAST
TECHNIQUE: Multiplanar, multiecho pulse sequences of the brain and surrounding
structures were obtained without and with intravenous contrast.
CONTRAST:  6mL GADAVIST GADOBUTROL 1 MMOL/ML IV SOLN

[Series 5: DWI · axial · 3.0mm · 0.88mm/px · z∈[-127,+2]mm · 4 of 100 slices shown (1 of 4)]
[im 1/100]
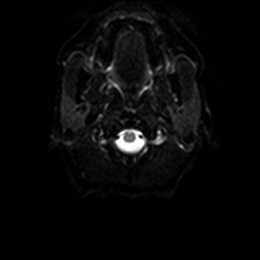
[im 34/100]
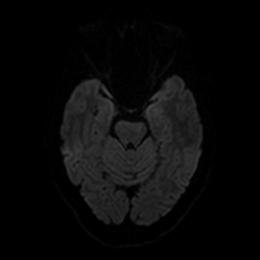
[im 67/100]
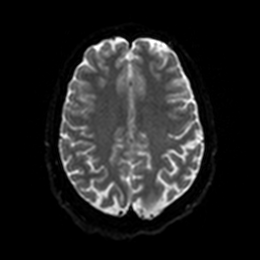
[im 100/100]
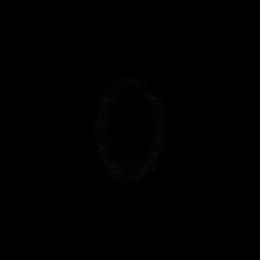

[Series 6: DWI · axial · 3.0mm · 0.88mm/px · z∈[-127,+2]mm · 2 of 50 slices shown (2 of 4)]
[im 1/50]
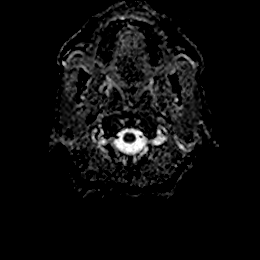
[im 50/50]
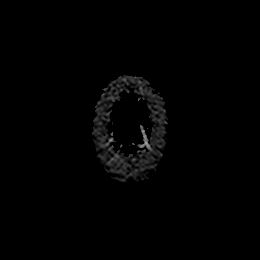

[Series 7: DWI · coronal · 4.0mm · 0.88mm/px · 3 of 70 slices shown (3 of 4)]
[im 1/70]
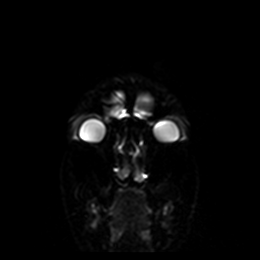
[im 35/70]
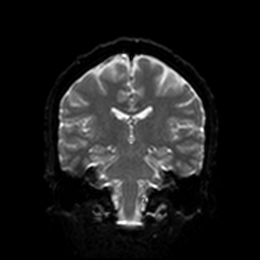
[im 70/70]
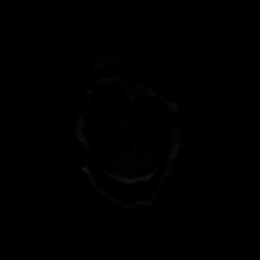

[Series 8: DWI · coronal · 4.0mm · 0.88mm/px · 2 of 35 slices shown (4 of 4)]
[im 1/35]
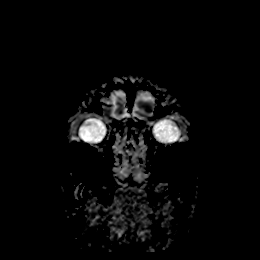
[im 35/35]
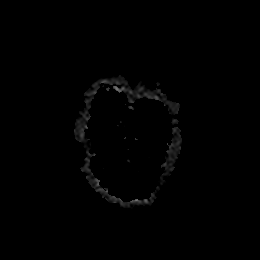

[Series 9: T1 · sagittal · 5.0mm · 0.75mm/px · 1 of 25 slices shown]
[im 1/25]
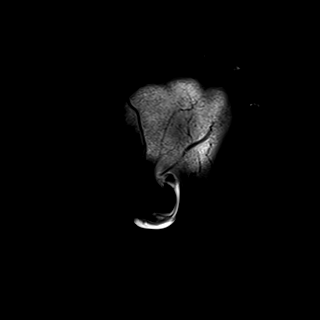

[Series 10: T2 · axial · 5.0mm · 0.72mm/px · z∈[-140,+3]mm · 2 of 28 slices shown]
[im 1/28]
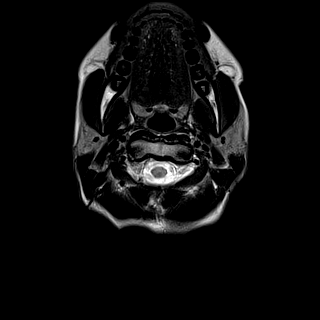
[im 28/28]
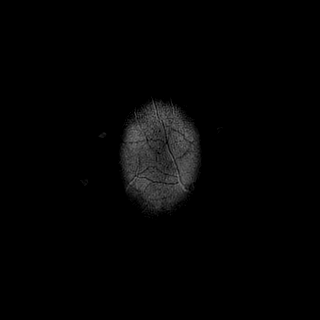

[Series 11: FLAIR · axial · 5.0mm · 0.45mm/px · z∈[-140,+3]mm · 2 of 28 slices shown]
[im 1/28]
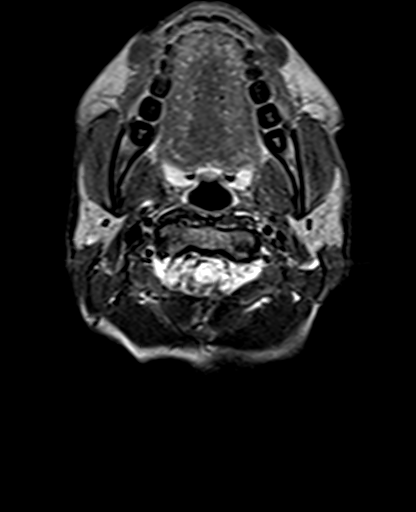
[im 28/28]
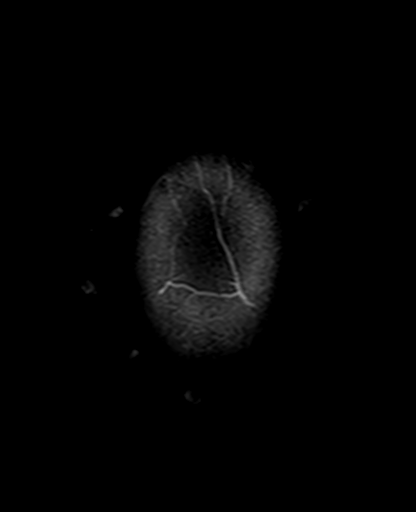

[Series 12: mag_images · axial · 3.0mm · 0.90mm/px · z∈[-142,+4]mm · 3 of 56 slices shown]
[im 1/56]
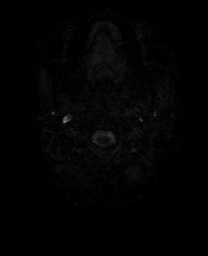
[im 28/56]
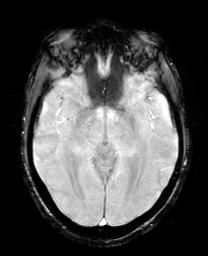
[im 56/56]
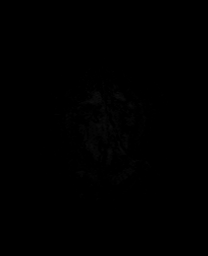

[Series 13: pha_images · axial · 3.0mm · 0.90mm/px · z∈[-142,+1]mm · 3 of 55 slices shown]
[im 1/55]
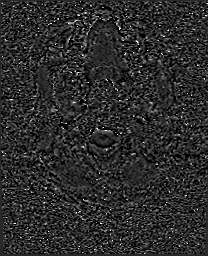
[im 28/55]
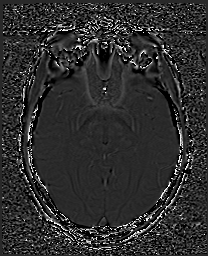
[im 55/55]
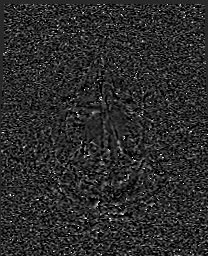

[Series 14: swi_images · axial · 3.0mm · 0.90mm/px · z∈[-142,+4]mm · 3 of 56 slices shown]
[im 1/56]
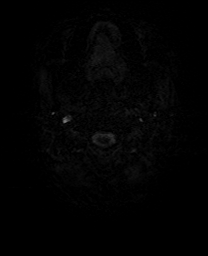
[im 28/56]
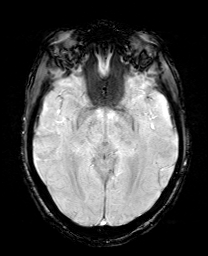
[im 56/56]
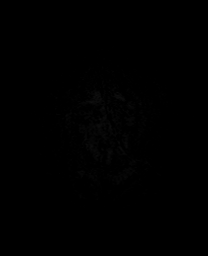

[Series 15: mip_images(sw) · axial · 24.0mm · 0.90mm/px · z∈[-133,-6]mm · 3 of 49 slices shown]
[im 1/49]
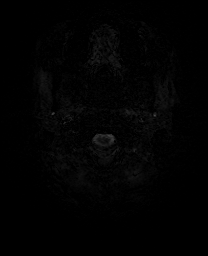
[im 25/49]
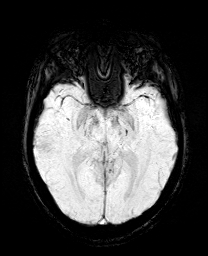
[im 49/49]
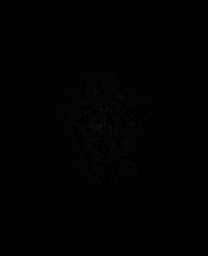

[Series 17: T2 post-contrast · coronal · 5.0mm · 0.72mm/px · 2 of 31 slices shown]
[im 1/31]
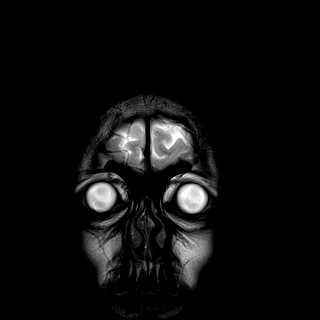
[im 31/31]
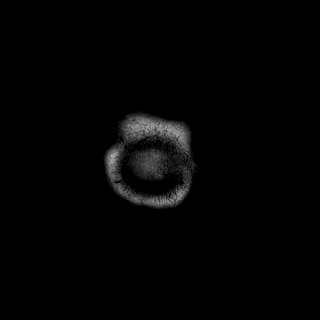

[Series 19: T1 post-contrast · coronal · 5.0mm · 0.34mm/px · 2 of 31 slices shown (1 of 2)]
[im 1/31]
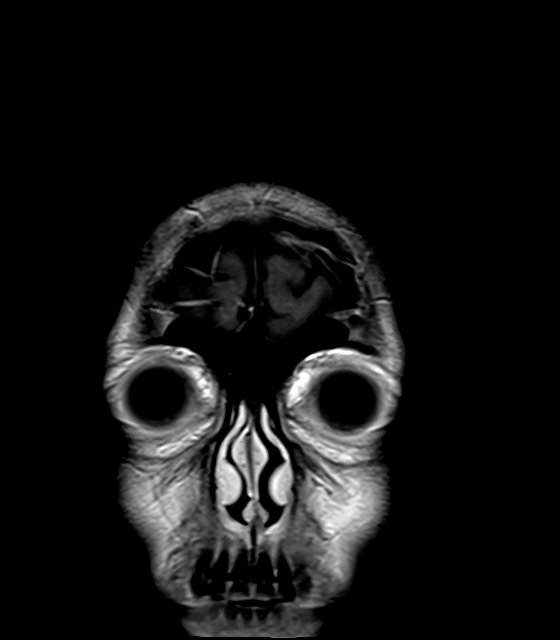
[im 31/31]
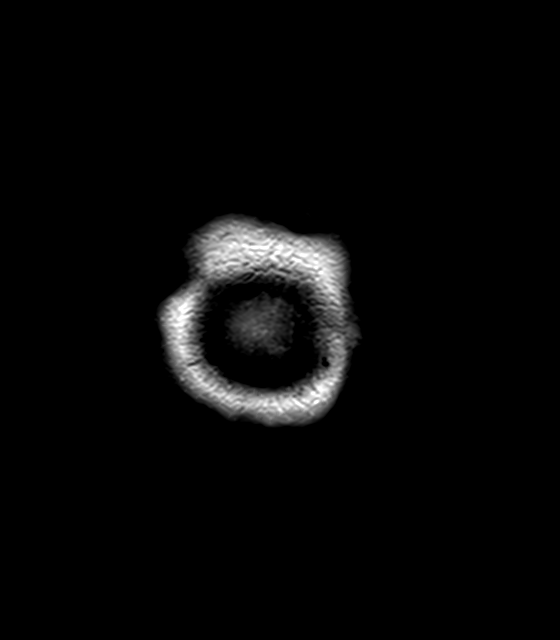

[Series 20: T1 post-contrast · sagittal · 5.0mm · 0.72mm/px · 1 of 23 slices shown (2 of 2)]
[im 1/23]
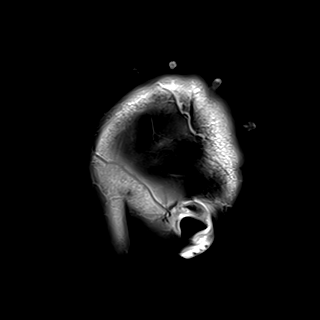

[33 of 48 positions shown; findings below may reference images not displayed]

FINDINGS: Brain: Cerebral volume within normal limits for patient age. No
focal parenchymal signal abnormality identified. No significant
cerebral white matter changes to suggest demyelinating disease.

No abnormal foci of restricted diffusion to suggest acute or
subacute ischemia. Gray-white matter differentiation well
maintained. No encephalomalacia to suggest chronic infarction. No
foci of susceptibility artifact to suggest acute or chronic
intracranial hemorrhage.

No mass lesion, midline shift or mass effect. No hydrocephalus. No
extra-axial fluid collection.

Pituitary gland and suprasellar region are normal. Midline
structures intact and normal.

No abnormal enhancement. Note made of a small focus of curvilinear
enhancement adjacent to the posterior left lateral ventricle on
sagittal postcontrast sequence (series 20, image 16), favored to be
vascular in nature.

Vascular: Major intracranial vascular flow voids well maintained and
normal in appearance.

Skull and upper cervical spine: Craniocervical junction normal.
Visualized upper cervical spine within normal limits. Bone marrow
signal intensity normal. No scalp soft tissue abnormality.

Sinuses/Orbits: Globes and orbital soft tissues within normal
limits.

Paranasal sinuses are clear. No mastoid effusion. Inner ear
structures normal.

Other: None.
IMPRESSION: Normal brain MRI. No acute intracranial abnormality. No findings to
suggest demyelinating disease.

## 2021-03-24 IMAGING — CT CT CTA ABD/PEL W/CM AND/OR W/O CM
2 of 12 series · 12 of 46 positions shown, 17 images · IV contrast (APPLIED)
Comparison: None.

CLINICAL DATA: Mesenteric ischemia, acute pain out of proportion
with exam, localized to RUQ - family hx of sarcoidosis, clotting
disorder, marfan's syndrome - evaluate for mesenteric occlusion. C/o
RUQ pain and diarrhea x 2 weeks. States pain worse since last night.
[REDACTED] last night and states she was discharged and told to take
Tylenol

EXAM:
CTA ABDOMEN AND PELVIS WITHOUT AND WITH CONTRAST
TECHNIQUE: Multidetector CT imaging of the abdomen and pelvis was performed
using the standard protocol during bolus administration of
intravenous contrast. Multiplanar reconstructed images and MIPs were
obtained and reviewed to evaluate the vascular anatomy.
CONTRAST:  75mL OMNIPAQUE IOHEXOL 350 MG/ML SOLN

[Series 8: venous thins · axial · portal-venous · 0.66mm/px · z∈[+763,+1116]mm · 10 of 1060 slices shown, 15 images]
[im 89/1060  soft-tissue]
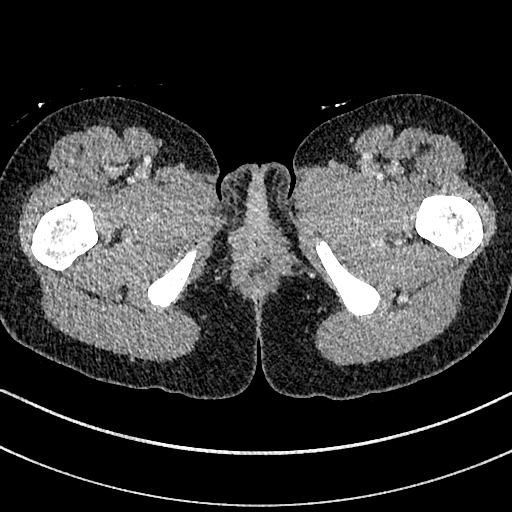
[im 89/1060  bone]
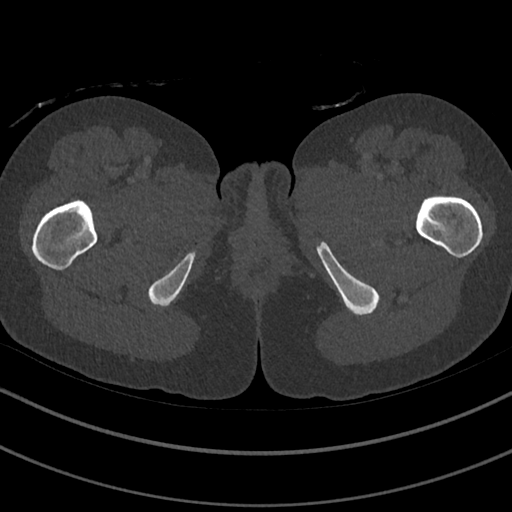
[im 177/1060  soft-tissue]
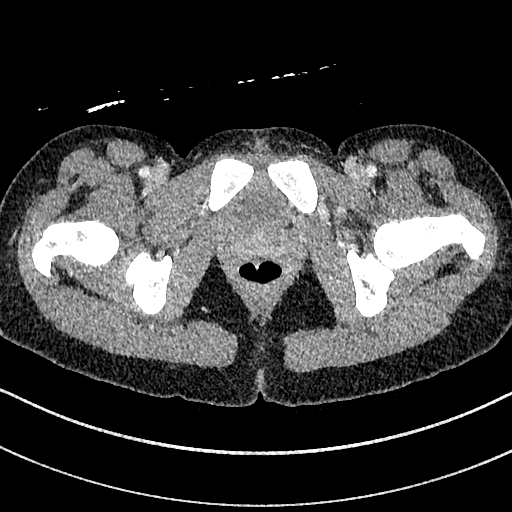
[im 354/1060  soft-tissue]
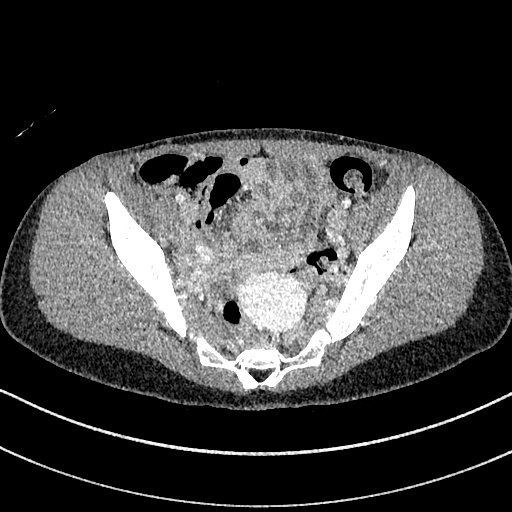
[im 442/1060  soft-tissue]
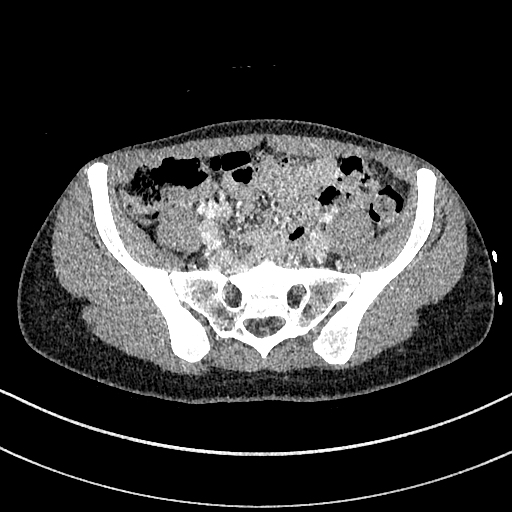
[im 530/1060  soft-tissue]
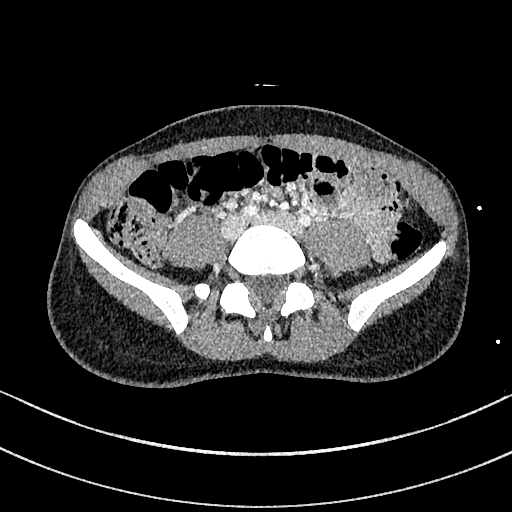
[im 618/1060  soft-tissue]
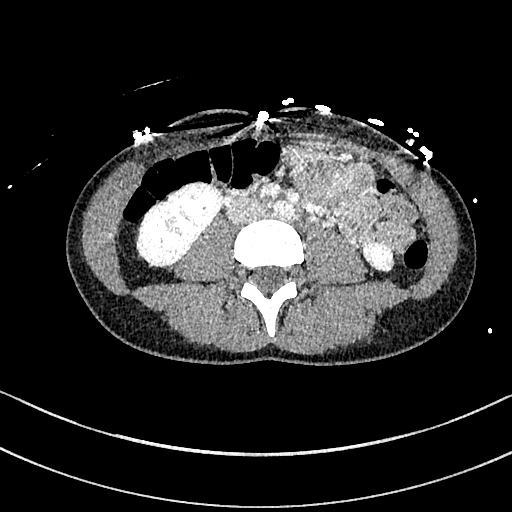
[im 707/1060  soft-tissue]
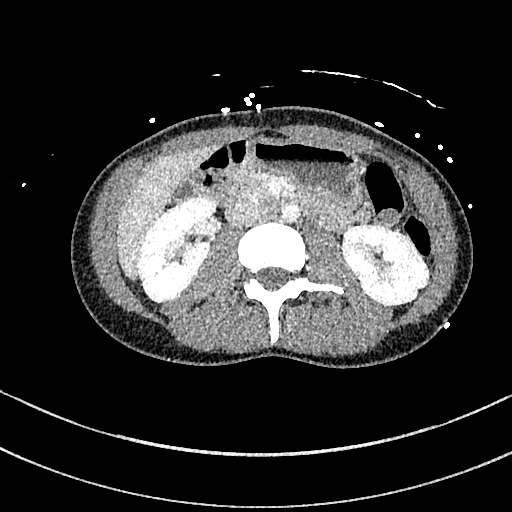
[im 707/1060  lung]
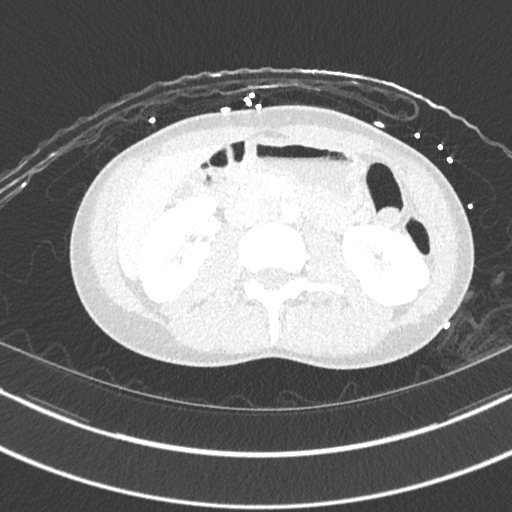
[im 795/1060  lung]
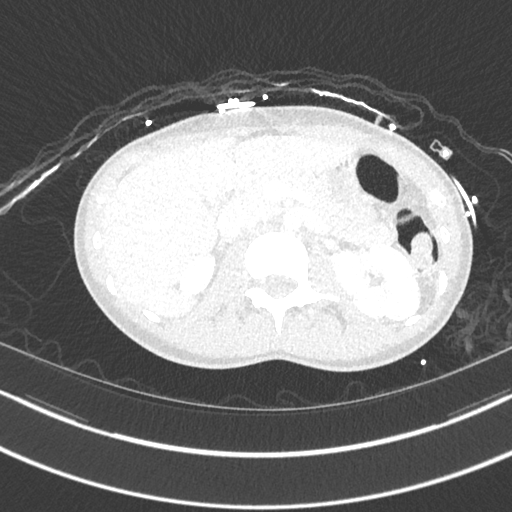
[im 883/1060  soft-tissue]
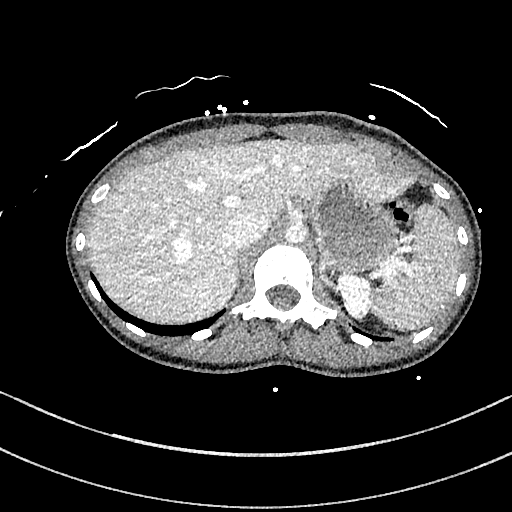
[im 883/1060  lung]
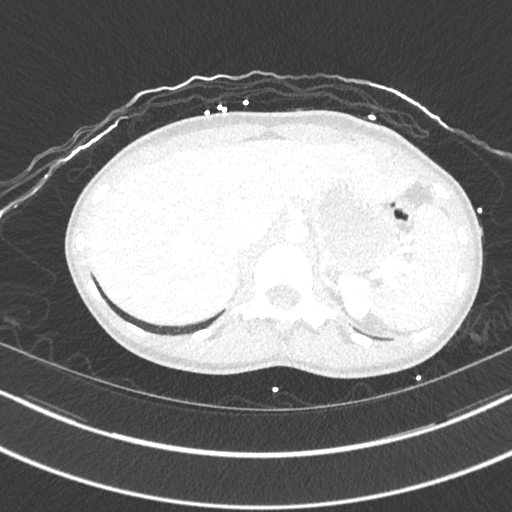
[im 971/1060  soft-tissue]
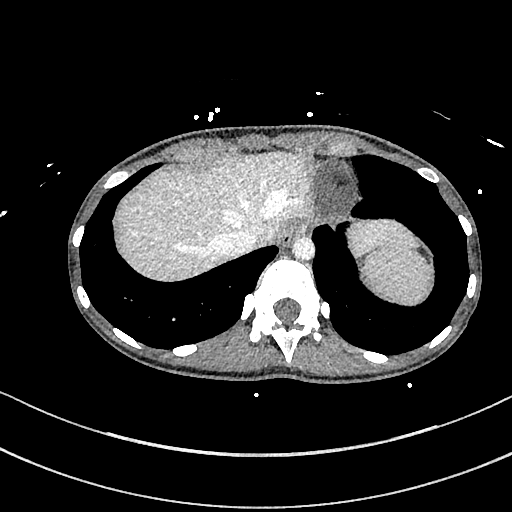
[im 971/1060  lung]
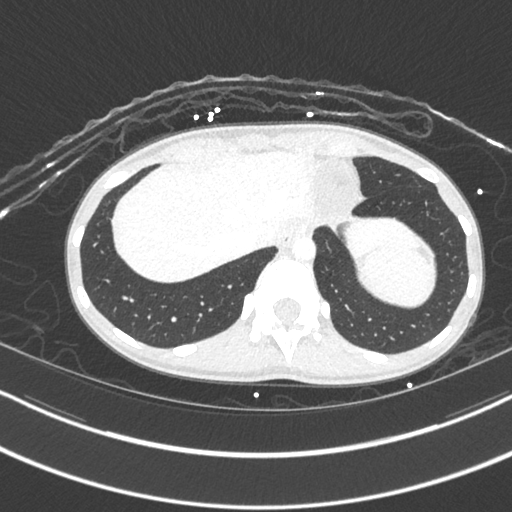
[im 971/1060  bone]
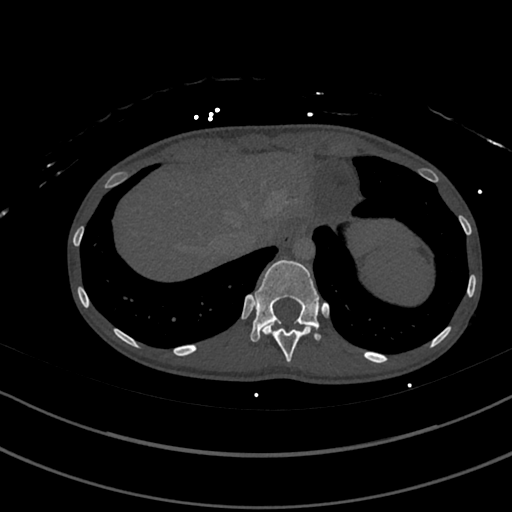

[Series 11: cor · coronal · 0.62mm/px · 2 of 111 slices shown]
[im 37/111  soft-tissue]
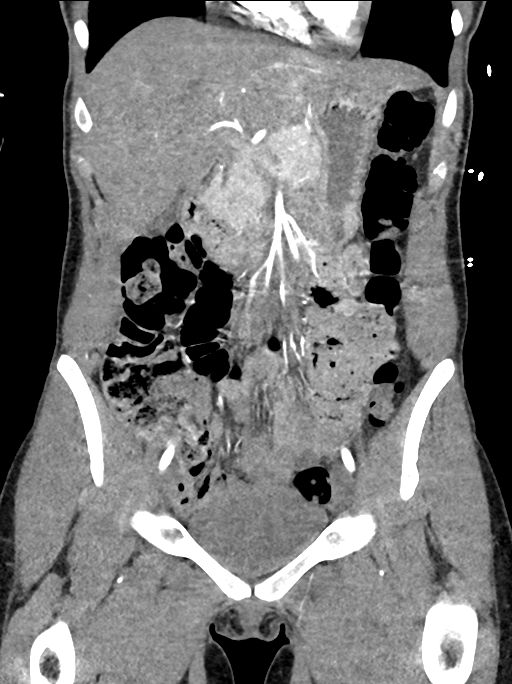
[im 74/111  soft-tissue]
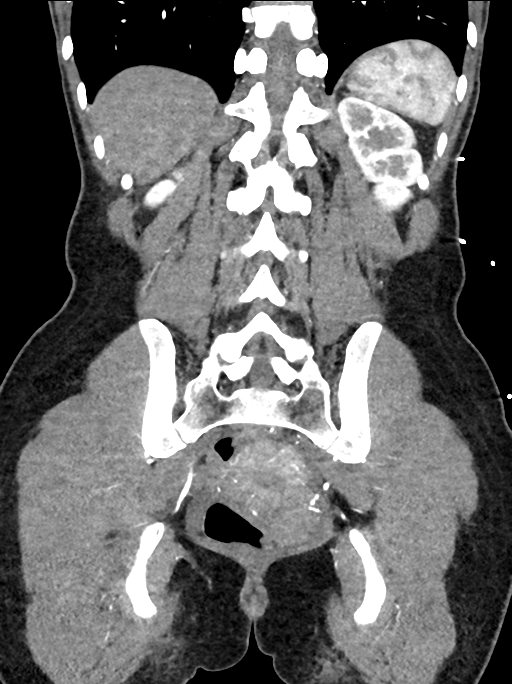

[12 of 46 positions shown; findings below may reference images not displayed]

FINDINGS: VASCULAR

Aorta: Normal caliber aorta without aneurysm, dissection, vasculitis
or significant stenosis.

Celiac: Patent without evidence of aneurysm, dissection, vasculitis
or significant stenosis.

SMA: Patent without evidence of aneurysm, dissection, vasculitis or
significant stenosis.

Renals: Both renal arteries are patent without evidence of aneurysm,
dissection, vasculitis, fibromuscular dysplasia or significant
stenosis.

IMA: Patent without evidence of aneurysm, dissection, vasculitis or
significant stenosis.

Inflow: Patent without evidence of aneurysm, dissection, vasculitis
or significant stenosis.

Proximal Outflow: Bilateral common femoral and visualized portions
of the superficial and profunda femoral arteries are patent without
evidence of aneurysm, dissection, vasculitis or significant
stenosis.

Veins: The main portal, splenic, superior mesenteric veins are
patent. No portal venous gas. No mesenteric venous gas.

Review of the MIP images confirms the above findings.

NON-VASCULAR

Lower chest: No acute abnormality.

Hepatobiliary: No focal liver abnormality. No gallstones,
gallbladder wall thickening, or pericholecystic fluid. No biliary
dilatation.

Pancreas: No focal lesion. Normal pancreatic contour. No surrounding
inflammatory changes. No main pancreatic ductal dilatation.

Spleen: Normal in size without focal abnormality.

Adrenals/Urinary Tract:

No adrenal nodule bilaterally.

Bilateral kidneys enhance symmetrically.

No hydronephrosis. No hydroureter.

The urinary bladder is unremarkable.

Stomach/Bowel: Stomach is within normal limits. No evidence of bowel
wall thickening or dilatation. Appendix appears normal.

Lymphatic: No lymphadenopathy.

Reproductive: Uterus and bilateral adnexa are unremarkable.

Other: No intraperitoneal free fluid. No intraperitoneal free gas.
No organized fluid collection.

Musculoskeletal:

No abdominal wall hernia or abnormality.

Densely sclerotic lesion of the right iliac bone likely represents a
bone island. Otherwise no suspicious lytic or blastic osseous
lesions. No acute displaced fracture.
IMPRESSION: VASCULAR

No acute intra-abdominal or intrapelvic abnormality.

NON-VASCULAR

No acute intra-abdominal or intrapelvic abnormality.

## 2021-03-24 IMAGING — US US ABDOMEN LIMITED
1 series · 15 of 25 positions shown · non-contrast
Comparison: None.

CLINICAL DATA: Abdominal pain

EXAM:
ULTRASOUND ABDOMEN LIMITED RIGHT UPPER QUADRANT

[Series 1: us abdomen limited ruq mc & wl · 15 of 37 slices shown]
[im 1/37]
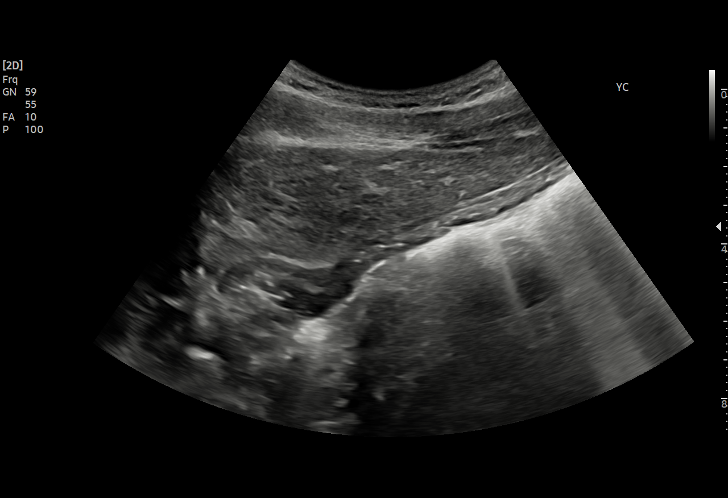
[im 4/37]
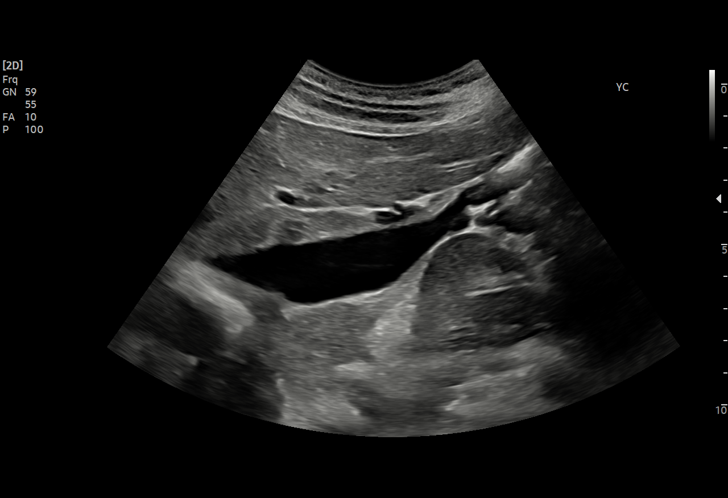
[im 7/37]
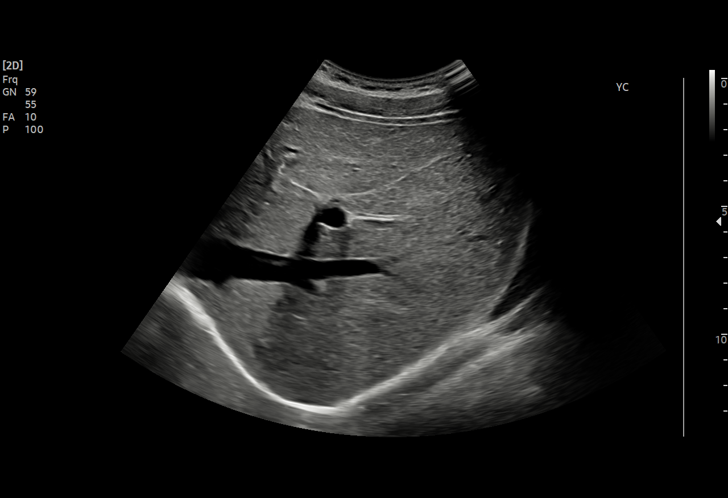
[im 8/37]
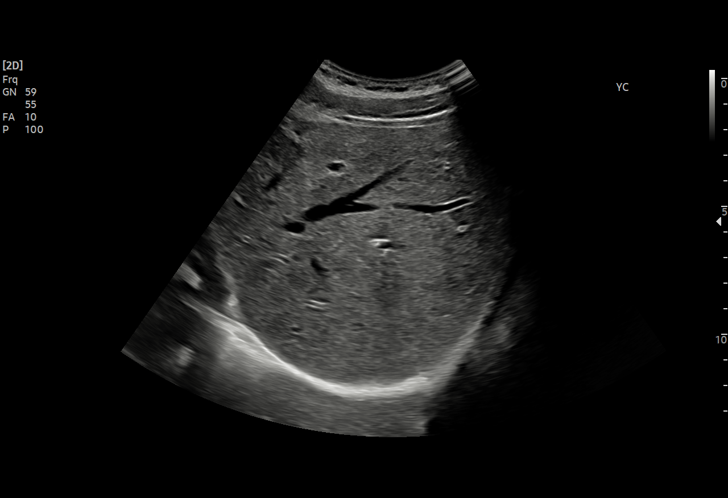
[im 11/37]
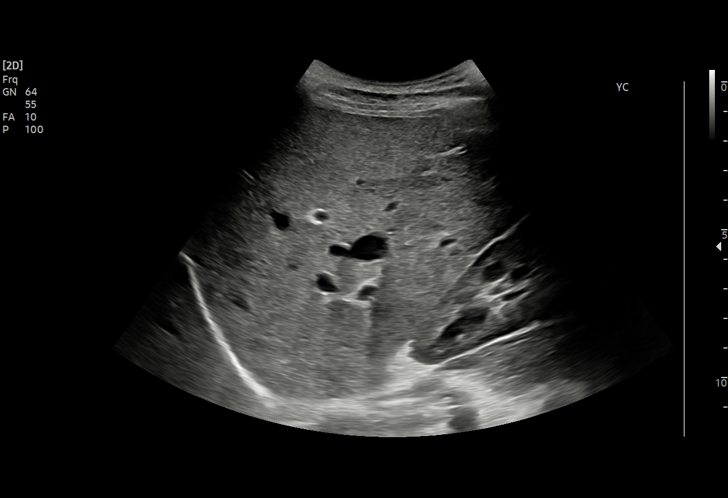
[im 14/37]
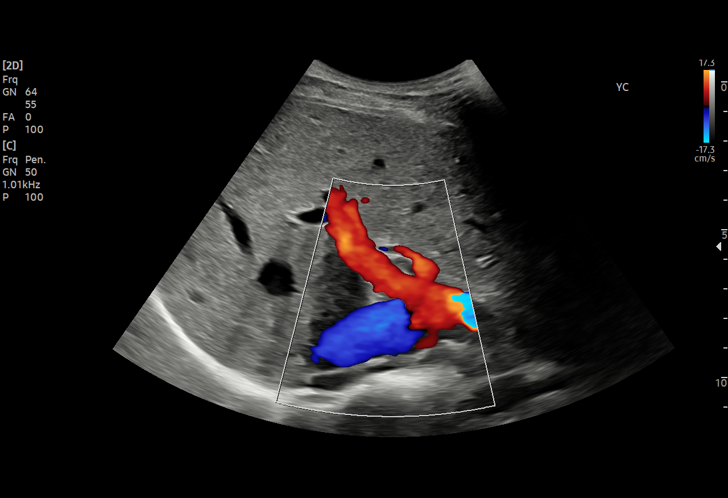
[im 16/37]
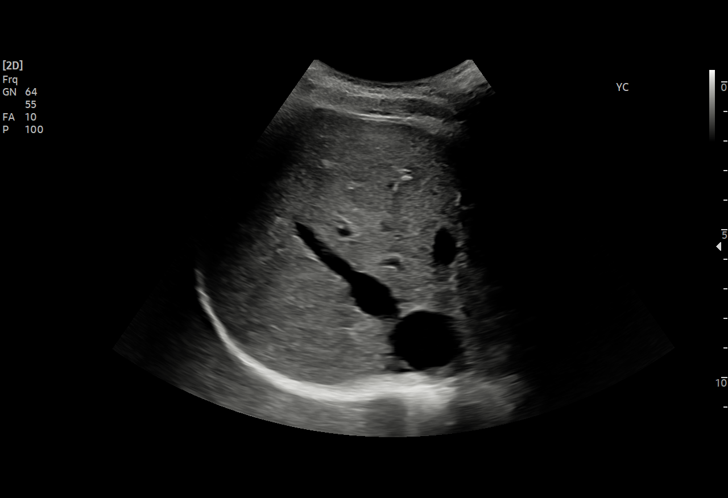
[im 19/37]
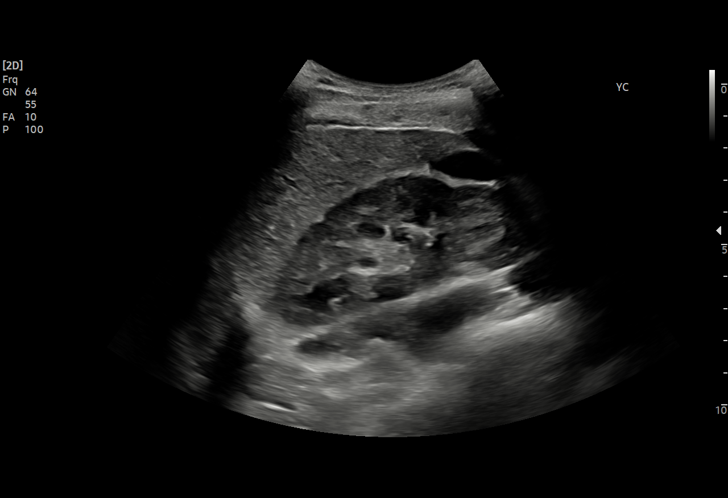
[im 22/37]
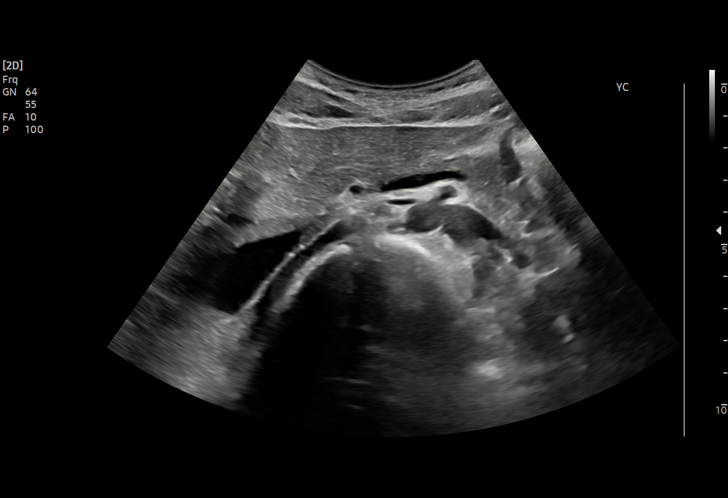
[im 23/37]
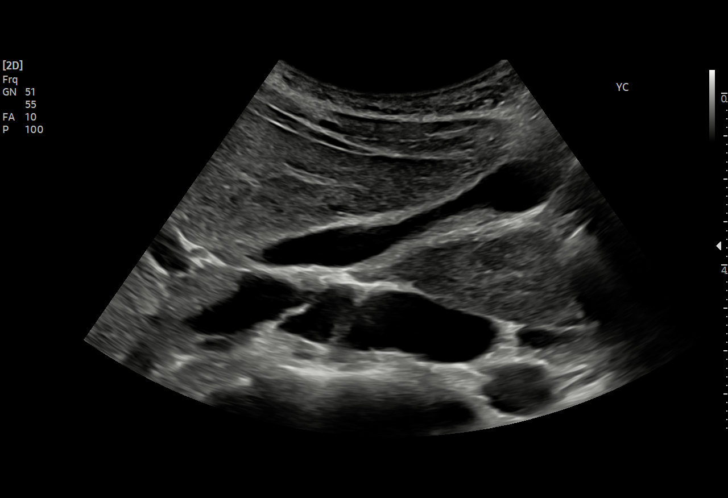
[im 26/37]
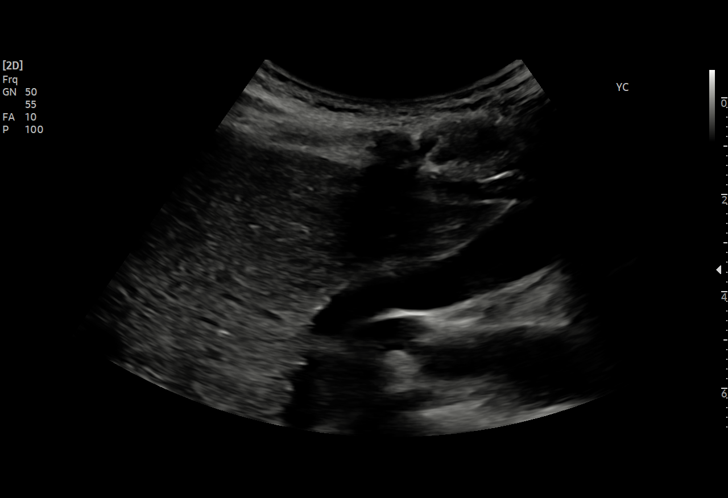
[im 29/37]
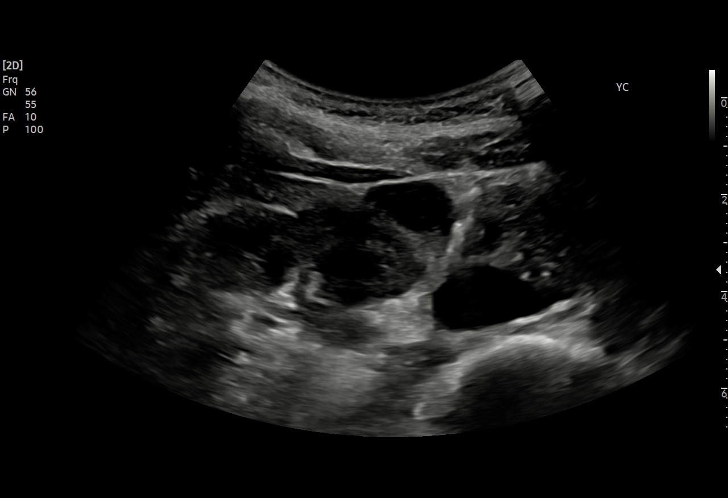
[im 31/37]
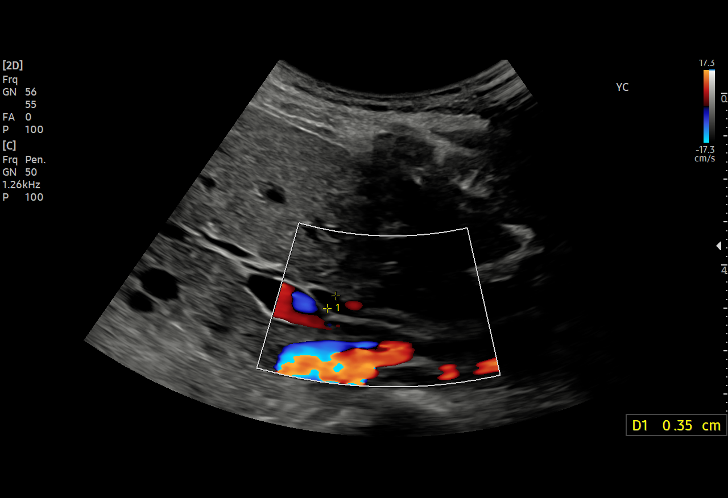
[im 34/37]
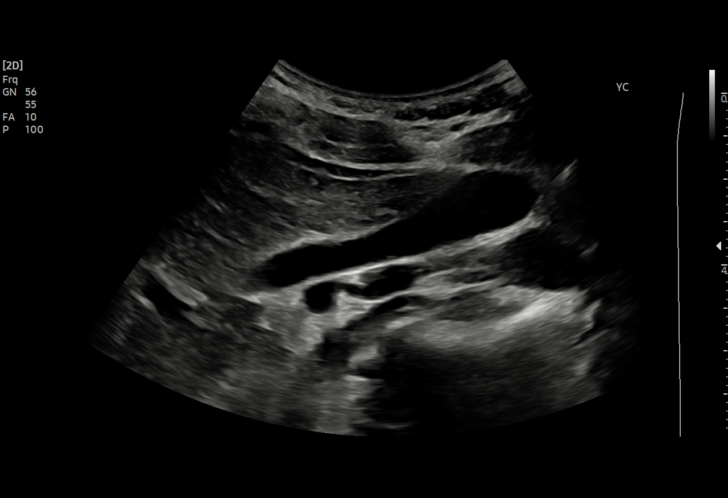
[im 37/37]
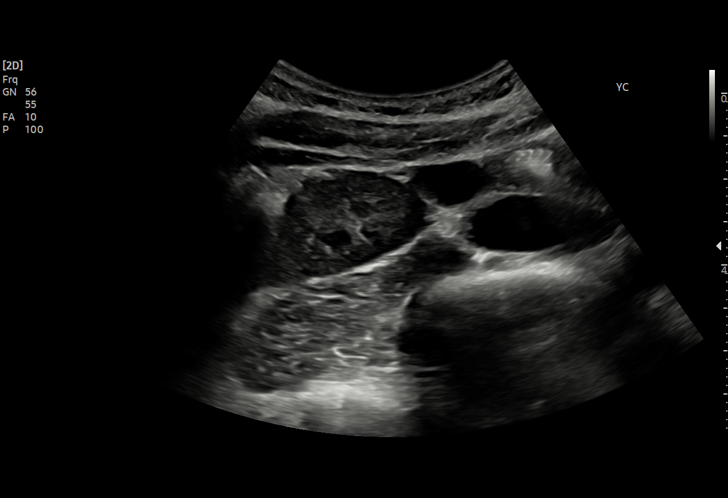

[15 of 25 positions shown; findings below may reference images not displayed]

FINDINGS: Gallbladder:

No gallstones or wall thickening visualized. No sonographic Murphy
sign noted by sonographer.

Common bile duct:

Diameter: 3-4 mm

Liver:

No focal lesion identified. Within normal limits in parenchymal
echogenicity. Portal vein is patent on color Doppler imaging with
normal direction of blood flow towards the liver.
IMPRESSION: Normal right upper quadrant ultrasound.

## 2021-03-24 MED ORDER — LORAZEPAM 2 MG/ML IJ SOLN
1.0000 mg | Freq: Once | INTRAMUSCULAR | Status: AC | PRN
  Administered 2021-03-24: 1 mg via INTRAVENOUS
  Filled 2021-03-24: qty 1

## 2021-03-24 MED ORDER — GADOBUTROL 1 MMOL/ML IV SOLN
6.0000 mL | Freq: Once | INTRAVENOUS | Status: AC | PRN
  Administered 2021-03-24: 6 mL via INTRAVENOUS

## 2021-03-24 MED ORDER — IOHEXOL 350 MG/ML SOLN
75.0000 mL | Freq: Once | INTRAVENOUS | Status: AC | PRN
  Administered 2021-03-24: 75 mL via INTRAVENOUS

## 2021-03-24 NOTE — ED Triage Notes (Signed)
C/o RUQ pain and diarrhea x 2 weeks.  States pain worse since last night.  Seen at [facility] last night and states she was discharged and told to take Tylenol.

## 2021-03-24 NOTE — ED Notes (Signed)
Pt ambulatory to the nurse's station to ask about an update. She was in no distress.

## 2021-03-24 NOTE — ED Notes (Signed)
Pt back from MRI 

## 2021-03-24 NOTE — ED Notes (Signed)
Ultrasound tech paged

## 2021-03-24 NOTE — Discharge Instructions (Signed)
Return for any problem.   Follow up with your regular care providers as instructed in 3-4 days.   Use ibuprofen and tylenol as instructed.

## 2021-03-24 NOTE — ED Notes (Signed)
Patient transported to CT 

## 2021-03-24 NOTE — ED Provider Notes (Signed)
[facility] EMERGENCY DEPARTMENT Provider Note   CSN: [id number] Arrival date & time: 03/24/21  1433     History Chief Complaint  Patient presents with   Abdominal Pain    Kristin Ingram is a 20 y.o. female presenting to the emergency department with complaint of abdominal pain and weakness in her legs.  Supplemental history is provided by the patient's mother on the phone.  The patient reportedly only has a history of depression, however there is significant family history of vascular and autoimmune disease (sarcoidosis in mother; marfan's syndrome in maternal grandmother; MS in maternal uncle; HOCM).  The patient began complaining of colicky right upper quadrant abdominal pain beginning about 2 weeks ago.  She reports severe stabbing pains in her right upper quadrant that seem to come at random, not associated movement with eating.  This will last a few seconds and then released.  She says it feels like her whole body is clenched up around this.  She has never had this problem for.  She denies history of UTI, kidney stone.  No medications been helping his pain.  She was approximately yesterday she has difficulty ambulating.  She said she feels that she cannot walk or bear weight.  She has normal sensation and strength in her legs.  She reports some numbness and tingling in her fingertips as well.  The patient did go to [facility] emergency department yesterday evening for the abdominal pain.  At that point she had a right upper quadrant ultrasound which was unremarkable.  She had blood test including a negative pregnancy test, unremarkable UA, and normal white blood cell count and LFTs.  Tbili 1.4 yesterday.  Now presents to our ED because she continues having abdominal pain, and her mother and her are concerned about her sudden weakness/difficulty walking.  HPI     History reviewed. No pertinent past medical history.  Patient Active Problem List    Diagnosis Date Noted   Adjustment disorder with mixed anxiety and depressed mood    Migraine without aura and without status migrainosus, not intractable 12/26/2015   Tension headache 12/26/2015   Vasovagal syncope 12/26/2015    History reviewed. No pertinent surgical history.   OB History   No obstetric history on file.     Family History  Problem Relation Age of Onset   Heart attack Maternal Grandfather     Social History   Tobacco Use   Smoking status: Never   Smokeless tobacco: Never  Substance Use Topics   Alcohol use: No   Drug use: No    Home Medications Prior to Admission medications   Medication Sig Start Date End Date Taking? Authorizing Provider  doxycycline (VIBRAMYCIN) 100 MG capsule Take 100 mg by mouth 2 (two) times daily. 01/09/21  Yes [provider]  FLUoxetine (PROZAC) 20 MG capsule Take 20 mg by mouth every morning. 01/15/21  Yes [provider]  Multiple Vitamins-Minerals (MULTIVITAMIN WITH MINERALS) tablet Take 1 tablet by mouth daily.   Yes [provider]    Allergies    Amoxicillin  Review of Systems   Review of Systems  Constitutional:  Negative for chills and fever.  Eyes:  Negative for photophobia and visual disturbance.  Respiratory:  Negative for cough and shortness of breath.   Cardiovascular:  Negative for chest pain and palpitations.  Gastrointestinal:  Positive for abdominal pain. Negative for constipation, diarrhea, nausea and vomiting.  Genitourinary:  Negative for dysuria and hematuria.  Musculoskeletal:  Negative for arthralgias and myalgias.  Skin:  Negative for color change and rash.  Neurological:  Positive for weakness. Negative for syncope, light-headedness, numbness and headaches.  All other systems reviewed and are negative.  Physical Exam Updated Vital Signs BP 114/64   Pulse 83   Temp 98.5 F (36.9 C) (Oral)   Resp 14   LMP 02/22/2021   SpO2 100%   Physical Exam Constitutional:       General: She is not in acute distress. HENT:     Head: Normocephalic and atraumatic.  Eyes:     Conjunctiva/sclera: Conjunctivae normal.     Pupils: Pupils are equal, round, and reactive to light.  Cardiovascular:     Rate and Rhythm: Normal rate and regular rhythm.  Pulmonary:     Effort: Pulmonary effort is normal. No respiratory distress.  Abdominal:     General: There is no distension.     Tenderness: There is no abdominal tenderness. There is no right CVA tenderness, left CVA tenderness, guarding or rebound. Negative signs include Murphy's sign and McBurney's sign.  Skin:    General: Skin is warm and dry.  Neurological:     General: No focal deficit present.     Mental Status: She is alert. Mental status is at baseline.     GCS: GCS eye subscore is 4. GCS verbal subscore is 5. GCS motor subscore is 6.     Cranial Nerves: Cranial nerves 2-12 are intact.     Sensory: Sensation is intact.     Motor: Motor function is intact.     Coordination: Finger-Nose-Finger Test normal.     Comments: 5/5 ankle flexion and extenion bilaterally 4/5 hip flexion bilaterally Unable to bear ambulate - wide, ataxic gait  Psychiatric:        Mood and Affect: Mood normal.        Behavior: Behavior normal.    ED Results / Procedures / Treatments   Labs (all labs ordered are listed, but only abnormal results are displayed) Labs Reviewed  CBC WITH DIFFERENTIAL/PLATELET - Abnormal; Notable for the following components:      Result Value   HCT 35.1 (*)    All other components within normal limits  COMPREHENSIVE METABOLIC PANEL - Abnormal; Notable for the following components:   CO2 21 (*)    Total Bilirubin 1.3 (*)    All other components within normal limits  RESP PANEL BY RT-PCR (FLU A&B, COVID) ARPGX2  LIPASE, BLOOD  TROPONIN I (HIGH SENSITIVITY)    EKG EKG Interpretation  Date/Time:  Sunday March 24 2021 18:52:30 EDT Ventricular Rate:  74 PR Interval:  187 QRS Duration: 68 QT  Interval:  370 QTC Calculation: 411 R Axis:   63 Text Interpretation: Sinus rhythm Confirmed by Alvester Chou [id number]) on 03/24/2021 10:05:57 PM  Radiology MR BRAIN W WO CONTRAST  Result Date: 03/24/2021 CLINICAL DATA:  Initial evaluation for possible new onset and mass, family history of a mass. Ataxia with bilateral leg weakness. EXAM: MRI HEAD WITHOUT AND WITH CONTRAST TECHNIQUE: Multiplanar, multiecho pulse sequences of the brain and surrounding structures were obtained without and with intravenous contrast. CONTRAST:  22mL GADAVIST GADOBUTROL 1 MMOL/ML IV SOLN COMPARISON:  None. FINDINGS: Brain: Cerebral volume within normal limits for patient age. No focal parenchymal signal abnormality identified. No significant cerebral white matter changes to suggest demyelinating disease. No abnormal foci of restricted diffusion to suggest acute or subacute ischemia. Gray-white matter differentiation well maintained. No encephalomalacia to suggest chronic infarction.  No foci of susceptibility artifact to suggest acute or chronic intracranial hemorrhage. No mass lesion, midline shift or mass effect. No hydrocephalus. No extra-axial fluid collection. Pituitary gland and suprasellar region are normal. Midline structures intact and normal. No abnormal enhancement. Note made of a small focus of curvilinear enhancement adjacent to the posterior left lateral ventricle on sagittal postcontrast sequence (series 20, image 16), favored to be vascular in nature. Vascular: Major intracranial vascular flow voids well maintained and normal in appearance. Skull and upper cervical spine: Craniocervical junction normal. Visualized upper cervical spine within normal limits. Bone marrow signal intensity normal. No scalp soft tissue abnormality. Sinuses/Orbits: Globes and orbital soft tissues within normal limits. Paranasal sinuses are clear. No mastoid effusion. Inner ear structures normal. Other: None. IMPRESSION: Normal brain MRI.  No acute intracranial abnormality. No findings to suggest demyelinating disease. Electronically Signed   By: Rise Mu M.D.   On: 03/24/2021 23:40   MR Cervical Spine W or Wo Contrast  Result Date: 03/24/2021 CLINICAL DATA:  Initial evaluation for possible multiple sclerosis, new event. EXAM: MRI CERVICAL SPINE WITHOUT AND WITH CONTRAST TECHNIQUE: Multiplanar and multiecho pulse sequences of the cervical spine, to include the craniocervical junction and cervicothoracic junction, were obtained without and with intravenous contrast. CONTRAST:  6mL GADAVIST GADOBUTROL 1 MMOL/ML IV SOLN COMPARISON:  None. FINDINGS: Alignment: Straightening of the normal cervical lordosis. No listhesis. Vertebrae: Vertebral body height maintained without acute or chronic fracture. Bone marrow signal intensity within normal limits. No discrete or worrisome osseous lesions. No abnormal marrow edema or enhancement. Cord: Normal signal morphology. No findings to suggest demyelinating disease. No abnormal enhancement. Normal cord caliber and morphology. Posterior Fossa, vertebral arteries, paraspinal tissues: Unremarkable. Disc levels: Unremarkable. No significant disc pathology or facet degeneration. No canal or neural foraminal stenosis or evidence for neural impingement. IMPRESSION: Normal MRI of the cervical spine and spinal cord. No evidence for demyelinating disease or other abnormality. Electronically Signed   By: Rise Mu M.D.   On: 03/24/2021 23:44   MR THORACIC SPINE W WO CONTRAST  Result Date: 03/24/2021 CLINICAL DATA:  Initial evaluation for possible multiple sclerosis, ataxia. EXAM: MRI THORACIC WITHOUT AND WITH CONTRAST TECHNIQUE: Multiplanar and multiecho pulse sequences of the thoracic spine were obtained without and with intravenous contrast. CONTRAST:  6mL GADAVIST GADOBUTROL 1 MMOL/ML IV SOLN COMPARISON:  None. FINDINGS: Alignment: Physiologic with preservation of the normal thoracic  kyphosis. No listhesis. Vertebrae: Vertebral body height maintained without acute or chronic fracture. Bone marrow signal intensity within normal limits. No discrete or worrisome osseous lesions. No abnormal marrow edema or enhancement. Cord: Normal signal and morphology. No signal changes to suggest demyelinating disease. No abnormal enhancement. Paraspinal and other soft tissues: Unremarkable. Disc levels: T8-9: Disc desiccation with small right paracentral disc protrusion minimally indents the ventral thecal sac (series 23, image 29). No significant spinal stenosis. No other significant disc pathology seen within the thoracic spine. No other stenosis or neural impingement. IMPRESSION: 1. Normal MRI appearance of the thoracic spinal cord. No evidence for demyelinating disease. 2. Small right paracentral disc protrusion at T8-9 without stenosis. Electronically Signed   By: Rise Mu M.D.   On: 03/24/2021 23:50   CT Angio Abd/Pel W and/or Wo Contrast  Result Date: 03/24/2021 CLINICAL DATA:  Mesenteric ischemia, acute pain out of proportion with exam, localized to RUQ - family hx of sarcoidosis, clotting disorder, marfan's syndrome - evaluate for mesenteric occlusion. C/o RUQ pain and diarrhea x 2 weeks. States pain worse since  last night. Seen at [facility] last night and states she was discharged and told to take Tylenol EXAM: CTA ABDOMEN AND PELVIS WITHOUT AND WITH CONTRAST TECHNIQUE: Multidetector CT imaging of the abdomen and pelvis was performed using the standard protocol during bolus administration of intravenous contrast. Multiplanar reconstructed images and MIPs were obtained and reviewed to evaluate the vascular anatomy. CONTRAST:  37mL OMNIPAQUE IOHEXOL 350 MG/ML SOLN COMPARISON:  None. FINDINGS: VASCULAR Aorta: Normal caliber aorta without aneurysm, dissection, vasculitis or significant stenosis. Celiac: Patent without evidence of aneurysm, dissection, vasculitis or significant stenosis. SMA:  Patent without evidence of aneurysm, dissection, vasculitis or significant stenosis. Renals: Both renal arteries are patent without evidence of aneurysm, dissection, vasculitis, fibromuscular dysplasia or significant stenosis. IMA: Patent without evidence of aneurysm, dissection, vasculitis or significant stenosis. Inflow: Patent without evidence of aneurysm, dissection, vasculitis or significant stenosis. Proximal Outflow: Bilateral common femoral and visualized portions of the superficial and profunda femoral arteries are patent without evidence of aneurysm, dissection, vasculitis or significant stenosis. Veins: The main portal, splenic, superior mesenteric veins are patent. No portal venous gas. No mesenteric venous gas. Review of the MIP images confirms the above findings. NON-VASCULAR Lower chest: No acute abnormality. Hepatobiliary: No focal liver abnormality. No gallstones, gallbladder wall thickening, or pericholecystic fluid. No biliary dilatation. Pancreas: No focal lesion. Normal pancreatic contour. No surrounding inflammatory changes. No main pancreatic ductal dilatation. Spleen: Normal in size without focal abnormality. Adrenals/Urinary Tract: No adrenal nodule bilaterally. Bilateral kidneys enhance symmetrically. No hydronephrosis. No hydroureter. The urinary bladder is unremarkable. Stomach/Bowel: Stomach is within normal limits. No evidence of bowel wall thickening or dilatation. Appendix appears normal. Lymphatic: No lymphadenopathy. Reproductive: Uterus and bilateral adnexa are unremarkable. Other: No intraperitoneal free fluid. No intraperitoneal free gas. No organized fluid collection. Musculoskeletal: No abdominal wall hernia or abnormality. Densely sclerotic lesion of the right iliac bone likely represents a bone island. Otherwise no suspicious lytic or blastic osseous lesions. No acute displaced fracture. IMPRESSION: VASCULAR No acute intra-abdominal or intrapelvic abnormality. NON-VASCULAR No  acute intra-abdominal or intrapelvic abnormality. Electronically Signed   By: Tish Frederickson M.D.   On: 03/24/2021 19:49   US Abdomen Limited RUQ (LIVER/GB)  Result Date: 03/24/2021 CLINICAL DATA:  Abdominal pain EXAM: ULTRASOUND ABDOMEN LIMITED RIGHT UPPER QUADRANT COMPARISON:  None. FINDINGS: Gallbladder: No gallstones or wall thickening visualized. No sonographic Murphy sign noted by sonographer. Common bile duct: Diameter: 3-4 mm Liver: No focal lesion identified. Within normal limits in parenchymal echogenicity. Portal vein is patent on color Doppler imaging with normal direction of blood flow towards the liver. IMPRESSION: Normal right upper quadrant ultrasound. Electronically Signed   By: Tiburcio Pea M.D.   On: 03/24/2021 06:06    Procedures Procedures   Medications Ordered in ED Medications  LORazepam (ATIVAN) injection 1 mg (1 mg Intravenous Given 03/24/21 1946)  iohexol (OMNIPAQUE) 350 MG/ML injection 75 mL (75 mLs Intravenous Contrast Given 03/24/21 1920)  gadobutrol (GADAVIST) 1 MMOL/ML injection 6 mL (6 mLs Intravenous Contrast Given 03/24/21 2149)  gadobutrol (GADAVIST) 1 MMOL/ML injection 6 mL (6 mLs Intravenous Contrast Given 03/24/21 2158)  gadobutrol (GADAVIST) 1 MMOL/ML injection 6 mL (6 mLs Intravenous Contrast Given 03/24/21 2159)    ED Course  I have reviewed the triage vital signs and the nursing notes.  Pertinent labs & imaging results that were available during my care of the patient were reviewed by me and considered in my medical decision making (see chart for details).  Usual presentation evolving abdominal pain and also  some ataxia and difficulty with walking for the past 24 hours.  It is not clear to me that 2 of these are related, but may be unrelated.  I personally reviewed the patient's prior medical records including her work-up yesterday in the ER.  Her ultrasound and blood test do not suggest acute biliary disease.  She also does not have a positive  Murphy sign or focal findings suggestive of biliary disease.  Her labs here show normal white blood cell count, normal lipase, normal LFTs, unremarkable troponin, negative flu and COVID.  This seems less likely to be an acute infection.  Based on the supplemental history obtained from the patient's mother, she is at higher risk for autoimmune condition such as sarcoidosis, as well as possible multiple sclerosis.  Both of these conditions run in the family.  There is also family history of Marfan's syndrome and HOCM.  I interpreted and reviewed the patient's ECG today which shows NSR without evidence of arrhythmia or hypertrophy.  CTA of the abdomen ordered with no acute vascular injury or occlusion, no evident source of her abdominal pain.   Doubt PNA, aortic dissection. Doubt pelvic infection or PID.  Pending MR imaging to evaluate for MS or CNS lesions for ataxia. Clinically does not seem consistent with CNS infection or GBS.    Clinical Course as of 03/25/21 8119  Wynelle Link Mar 24, 2021  1900 For patient's ataxia and faimly hx of MS, I discussed the case with Dr Derry Lory, who recommended MRI brain, C and T spine with and without contrast [MT]  1909 Patient's mother is now present at the bedside.  Both the patient and her mother updated regarding imaging plans for MRI and CT abdomen.  They are both in agreement with proceeding.  As needed Ativan ordered for anxiety for MRI. [MT]  2245 On reassessment patient is now stable on her feet, walking easily and climbing a step in the ED, no weakness.  Her weakness or ataxia appears to have completely resolved.  Her mother is requesting going home as they have an early morning appointment tomorrow. [MT]  Mon Mar 25, 2021  1478 Update - MRI reviewed - no emergent findings.  Noted t8-t9 disc protrusion, right sided, which may well be causing the right upper quadrant pain she was complaining of.  I attempted to reach the patient by phone but was not able to.  I  also attempted to reach her mother twice and left a voicemail.  They can follow up with the neurologist regarding the transient ambulatory dysfunction. [MT]    Clinical Course User Index [MT] Andrik Sandt, Kermit Balo, MD    Final Clinical Impression(s) / ED Diagnoses Final diagnoses:  Right upper quadrant abdominal pain  Difficulty walking    Rx / DC Orders ED Discharge Orders          Ordered    Ambulatory referral to Neurology       Comments: An appointment is requested in approximately: 1 week Transient ataxia, seen in [facility] ED 03/24/21 - recommended by Dr Amada Jupiter to follow up in the office   03/24/21 2327             Terald Sleeper, MD 03/25/21 [phone]

## 2021-03-24 NOTE — ED Notes (Signed)
Pt back from CT

## 2021-03-24 NOTE — ED Provider Notes (Signed)
Emergency Medicine Provider Triage Evaluation Note  Crosby Bevan Calloway-Matthews , a 20 y.o. female  was evaluated in triage.  Pt complains of RUQ pain spasmodic in nature, with diarrhea. Patient was seen yesterday and d/c. Clear labwork and RUQ [place]. Pain is back today, severe in nature. No fever. Patient is shaking at time of my evaluation, reports tingling and numbness, "can't control my muscles"  Review of Systems  Positive: RUQ pain, shivers, spasm Negative: Chest pain, shortness of breaht  Physical Exam  BP (!) 125/99 (BP Location: Left Arm)   Pulse (!) 112   Temp 98.7 F (37.1 C)   Resp (!) 21   LMP 02/22/2021   SpO2 100%  Gen:   Awake, shaking, spasming, but alert, non-seizure appearance Resp:   Tachypneic MSK:   Able to move extremities but having difficulty controlling body Other:  Subjective numbness right foot  Medical Decision Making  Medical screening exam initiated at 2:56 PM.  Appropriate orders placed.  Jaelynne C Calloway-Matthews was informed that the remainder of the evaluation will be completed by another provider, this initial triage assessment does not replace that evaluation, and the importance of remaining in the ED until their evaluation is complete.  RUQ pain, shaking Patient was evaluated by Dr. Rush Landmark in triage   Olene Floss, PA-C 03/24/21 1459    Tegeler, Canary Brim, MD 03/24/21 1538

## 2021-03-24 NOTE — ED Notes (Signed)
US at bedside

## 2021-03-24 NOTE — Discharge Instructions (Addendum)
Your blood tests and scans did not show any emergency causes of your abdominal pain.  We did not find a clear source for your abdominal pain.  I do not see signs of inflammation around her gallbladder, or liver disease, pancreatitis, kidney stones, or other abdominal emergencies.  I would advise to continue taking Motrin and Tylenol as prescribed.  You also some weakness in your legs with walking when you came into the ER.  The symptoms resolved completely by the time you are discharged.  You did have MRI scans of your brain and your spine, but were not able to wait in the hospital for the final results.   I felt that since your symptoms had resolved, it would be reasonable to discharge you home and we will follow-up on your results.  I also placed a referral to the neurology clinic.  They should reach out to you within 2 business days to set up a follow-up appointment.

## 2021-03-24 NOTE — ED Provider Notes (Signed)
[facility]-EMERGENCY DEPT Provider Note   CSN: [id number] Arrival date & time: 03/23/21  2320     History Chief Complaint  Patient presents with   Abdominal Pain    Kristin Ingram is a 20 y.o. female.  20 year old female with prior medical history as detailed below presents for evaluation.  Patient reports intermittent right upper quadrant pain for the last 2 weeks.  Patient reports the pain is worse with movement.  Patient reports increased pain when reaching forward to pick up things.  She denies associated fever.  She denies nausea or vomiting.  She denies change in her bowel movements.  She denies urinary symptoms.  Patient has not taken anything at home for her symptoms.  Apparently last night she was exiting from a vehicle and as she stood she had increased pain and she decided to come to the ED for evaluation.  At the time of my evaluation she is comfortable and pain-free.  The history is provided by the patient.  Abdominal Pain Pain location:  RUQ Pain quality: aching   Pain radiates to:  Does not radiate Pain severity:  No pain Onset quality:  Gradual Duration:  2 weeks Timing:  Intermittent Progression:  Waxing and waning Chronicity:  New     History reviewed. No pertinent past medical history.  Patient Active Problem List   Diagnosis Date Noted   Adjustment disorder with mixed anxiety and depressed mood    Migraine without aura and without status migrainosus, not intractable 12/26/2015   Tension headache 12/26/2015   Vasovagal syncope 12/26/2015    History reviewed. No pertinent surgical history.   OB History   No obstetric history on file.     Family History  Problem Relation Age of Onset   Heart attack Maternal Grandfather     Social History   Tobacco Use   Smoking status: Never   Smokeless tobacco: Never  Substance Use Topics   Alcohol use: No   Drug use: No    Home Medications Prior to Admission  medications   Medication Sig Start Date End Date Taking? Authorizing Provider  clindamycin (CLINDAGEL) 1 % gel Apply 1 application topically 2 (two) times daily.    [provider]  Dapsone (ACZONE) 5 % topical gel Apply 1 Dose topically 2 (two) times daily.    [provider]  Magnesium Oxide 500 MG TABS Take by mouth.    [provider]  Multiple Vitamins-Minerals (MULTIVITAMIN WITH MINERALS) tablet Take 1 tablet by mouth daily.    [provider]  norelgestromin-ethinyl estradiol [employer]r [employer]ico) 150-35 MCG/24HR transdermal patch APPLY 1 PATCH WEEKLY FOR THREE WEEKS THEN OFF 1 WEEK 10/01/15   [provider]  riboflavin (VITAMIN B-2) 100 MG TABS tablet Take 100 mg by mouth daily.    [provider]    Allergies    Amoxicillin  Review of Systems   Review of Systems  Gastrointestinal:  Positive for abdominal pain.  All other systems reviewed and are negative.  Physical Exam Updated Vital Signs BP (!) 132/115   Pulse 74   Temp 98.1 F (36.7 C) (Oral)   Resp 16   Ht 5\' 7"  (1.702 m)   Wt 60.8 kg   SpO2 97%   BMI 20.99 kg/m   Physical Exam Vitals and nursing note reviewed.  Constitutional:      General: She is not in acute distress.    Appearance: Normal appearance. She is well-developed.  HENT:  Head: Normocephalic and atraumatic.  Eyes:     Conjunctiva/sclera: Conjunctivae normal.     Pupils: Pupils are equal, round, and reactive to light.  Cardiovascular:     Rate and Rhythm: Normal rate and regular rhythm.     Heart sounds: Normal heart sounds.  Pulmonary:     Effort: Pulmonary effort is normal. No respiratory distress.     Breath sounds: Normal breath sounds.  Abdominal:     General: Abdomen is flat. Bowel sounds are normal. There is no distension.     Palpations: Abdomen is soft.     Tenderness: There is no abdominal tenderness.  Musculoskeletal:        General: No deformity. Normal range of motion.     Cervical  back: Normal range of motion and neck supple.  Skin:    General: Skin is warm and dry.  Neurological:     General: No focal deficit present.     Mental Status: She is alert and oriented to person, place, and time.    ED Results / Procedures / Treatments   Labs (all labs ordered are listed, but only abnormal results are displayed) Labs Reviewed  CBC WITH DIFFERENTIAL/PLATELET - Abnormal; Notable for the following components:      Result Value   Hemoglobin 11.7 (*)    HCT 34.1 (*)    All other components within normal limits  COMPREHENSIVE METABOLIC PANEL - Abnormal; Notable for the following components:   Potassium 3.3 (*)    Total Bilirubin 1.4 (*)    All other components within normal limits  LIPASE, BLOOD  URINALYSIS, ROUTINE W REFLEX MICROSCOPIC  I-STAT BETA HCG BLOOD, ED ([facility], [facility], [facility] ONLY)    EKG None  Radiology US Abdomen Limited RUQ (LIVER/GB)  Result Date: 03/24/2021 CLINICAL DATA:  Abdominal pain EXAM: ULTRASOUND ABDOMEN LIMITED RIGHT UPPER QUADRANT COMPARISON:  None. FINDINGS: Gallbladder: No gallstones or wall thickening visualized. No sonographic Murphy sign noted by sonographer. Common bile duct: Diameter: 3-4 mm Liver: No focal lesion identified. Within normal limits in parenchymal echogenicity. Portal vein is patent on color Doppler imaging with normal direction of blood flow towards the liver. IMPRESSION: Normal right upper quadrant ultrasound. Electronically Signed   By: Tiburcio Pea M.D.   On: 03/24/2021 06:06    Procedures Procedures   Medications Ordered in ED Medications - No data to display  ED Course  I have reviewed the triage vital signs and the nursing notes.  Pertinent labs & imaging results that were available during my care of the patient were reviewed by me and considered in my medical decision making (see chart for details).    MDM Rules/Calculators/A&P                           MDM  MSE complete  Kristin Ingram was  evaluated in Emergency Department on 03/24/2021 for the symptoms described in the history of present illness. She was evaluated in the context of the global COVID-19 pandemic, which necessitated consideration that the patient might be at risk for infection with the SARS-CoV-2 virus that causes COVID-19. Institutional protocols and algorithms that pertain to the evaluation of patients at risk for COVID-19 are in a state of rapid change based on information released by regulatory bodies including the CDC and federal and state organizations. These policies and algorithms were followed during the patient's care in the ED.  Patient is presenting with complaint of right-sided abdominal discomfort.  Patient's described pain is intermittent.  She is currently pain-free.  ED evaluation and work-up is without evidence of significant acute pathology.  Patient is comfortable at time of my exam.  After work-up she is comfortable and desires discharge.  She does understand need for close follow-up.  Strict return cautions given and understood.    Final Clinical Impression(s) / ED Diagnoses Final diagnoses:  Abdominal pain    Rx / DC Orders ED Discharge Orders     None        Wynetta Fines, MD 03/24/21 [phone]

## 2021-03-24 NOTE — ED Notes (Signed)
Patient transported to MRI 

## 2021-03-24 NOTE — ED Provider Notes (Signed)
Emergency Medicine Provider Triage Evaluation Note  Kristin Ingram , a 20 y.o. female  was evaluated in triage.  Pt complains of abdominal pain x 2 weeks. RUQ, waxing/waning, worse in certain positions. No other alleviating/aggravating factors.   Review of Systems  Positive: Abdominal pain Negative: N/v, fever, melena, dysuria  Physical Exam  BP (!) 123/101 (BP Location: Left Arm)   Pulse (!) 108   Temp 98.1 F (36.7 C) (Oral)   Resp 20   Ht 5\' 7"  (1.702 m)   Wt 60.8 kg   SpO2 100%   BMI 20.99 kg/m  Gen:   Awake, no distress   Resp:  Normal effort  MSK:   Moves extremities without difficulty  Other:  RUQ tenderness to palpation.   Medical Decision Making  Medically screening exam initiated at 12:01 AM.  Appropriate orders placed.  Kristin Ingram was informed that the remainder of the evaluation will be completed by another provider, this initial triage assessment does not replace that evaluation, and the importance of remaining in the ED until their evaluation is complete.  Abdominal pain    , PA-C 03/24/21 0002    03/26/21, MD 03/24/21 [phone]

## 2021-03-25 ENCOUNTER — Ambulatory Visit (HOSPITAL_COMMUNITY): Payer: Medicaid Other | Admitting: Clinical

## 2021-03-26 ENCOUNTER — Ambulatory Visit: Payer: Self-pay | Admitting: Neurology

## 2021-03-27 ENCOUNTER — Telehealth: Payer: Self-pay | Admitting: Neurology

## 2021-03-27 NOTE — Telephone Encounter (Signed)
Pt's mother called states daughter is showing new onset symptoms that are causing concern regarding what she is being seen for on tomorrow. Pt's mother is requesting a call back.

## 2021-03-27 NOTE — Telephone Encounter (Signed)
Called the patient's mom back.  Since we have not seen this patient in our system there is no DPR on file.  I was able to get verbal permission from the patient to discuss information with her mom. The mom is just wanting to make sure it is brought to Dr. Bonnita Hollow attention that there has been no history of seizures or anything with this patient.  She was a fully functioning 20 year old with a job and completing her ADLs 100% herself.  She is now having to live with mom and her mom is having to assist her with going to the bathroom and other ADL's. This has all happened so quickly. They have had to go to the emergency room back-to-back due to the patient's health declining tremendously. The mother said they were able to get a doctor who completed a thorough exam.  She had an urgent visit with the primary care doctor who recommends the patient have an EEG done.  The mom states that the fact that the patient has declined so quickly and her health she is very concerned and wanted to know if we would be able to do the procedure on the same day.  I advised the patient that Dr. Epimenio Foot would have to be the one to do the assessment and move forward with that call.  She wanted me to verify if there was any openings on the schedule for EEG.  I confirmed that we did not have openings for tomorrow but I am not sure if a Dr. Epimenio Foot thinks it is necessary and be whether insurance will cover for her to have the EEG completed on the same day as her office visit.  The mother says she would be willing to pay out-of-pocket for the EEG (if Dr Epimenio Foot agrees) because she does not want to postpone any care for the patient.  I advised the patient's mom that Dr. Epimenio Foot will review the ER notes and images that were ordered for the patient and will determine what the next steps will be after completing an assessment tomorrow.  The mother was appreciative for the return call.  The mother also asked for our fax number so that she could have medical  records from her primary care sent over to our office.  I provided her wit our fax number and informed her to atn to Dr Emogene Morgan

## 2021-03-28 ENCOUNTER — Ambulatory Visit: Payer: Medicaid Other | Admitting: Neurology

## 2021-03-28 ENCOUNTER — Other Ambulatory Visit: Payer: Self-pay

## 2021-03-28 ENCOUNTER — Encounter: Payer: Self-pay | Admitting: Neurology

## 2021-03-28 VITALS — BP 129/70 | HR 68 | Ht 68.0 in | Wt 121.5 lb

## 2021-03-28 DIAGNOSIS — R252 Cramp and spasm: Secondary | ICD-10-CM

## 2021-03-28 DIAGNOSIS — E559 Vitamin D deficiency, unspecified: Secondary | ICD-10-CM

## 2021-03-28 DIAGNOSIS — F418 Other specified anxiety disorders: Secondary | ICD-10-CM | POA: Diagnosis not present

## 2021-03-28 DIAGNOSIS — G118 Other hereditary ataxias: Secondary | ICD-10-CM

## 2021-03-28 DIAGNOSIS — F489 Nonpsychotic mental disorder, unspecified: Secondary | ICD-10-CM | POA: Diagnosis not present

## 2021-03-28 MED ORDER — FLUOXETINE HCL 20 MG PO CAPS: 20.0000 mg | ORAL_CAPSULE | Freq: Every morning | ORAL | 3 refills | Status: DC
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
## 2021-03-29 LAB — MAGNESIUM: Magnesium: 2.1 mg/dL (ref 1.6–2.3)

## 2021-03-29 LAB — VITAMIN B12: Vitamin B-12: 656 pg/mL (ref 232–1245)

## 2021-03-29 LAB — VITAMIN D 25 HYDROXY (VIT D DEFICIENCY, FRACTURES): Vit D, 25-Hydroxy: 25.9 ng/mL — ABNORMAL LOW (ref 30.0–100.0)

## 2021-04-01 ENCOUNTER — Encounter: Payer: Self-pay | Admitting: Neurology

## 2021-04-01 ENCOUNTER — Ambulatory Visit: Payer: Medicaid Other | Admitting: Neurology

## 2021-04-01 DIAGNOSIS — F489 Nonpsychotic mental disorder, unspecified: Secondary | ICD-10-CM

## 2021-04-01 DIAGNOSIS — R252 Cramp and spasm: Secondary | ICD-10-CM

## 2021-04-01 DIAGNOSIS — G118 Other hereditary ataxias: Secondary | ICD-10-CM

## 2021-04-01 NOTE — Progress Notes (Signed)
   [facility]  EEG (ELECTROENCEPHALOGRAM) REPORT   STUDY DATE: 04/01/2021 PATIENT NAME: Kristin Ingram DOB: [birth year] MRN: [id number]  ORDERING CLINICIAN: Jullisa Grigoryan A. Tamaka Sawin, MD. PhD  TECHNIQUE: Electroencephalogram was recorded utilizing standard 10-20 system of lead placement and reformatted into average and bipolar montages.  RECORDING TIME: 26 minutes 40 seconds  CLINICAL INFORMATION: 20 year old woman with episodes of muscle clenching  FINDINGS: A digital EEG was performed while the patient was awake and drowsy. While awake and most alert there was a 10 hz posterior dominant rhythm. Voltages and frequencies were symmetric.  There were no focal, lateralizing, epileptiform activity or seizures seen.  Photic stimulation had a normal driving response.   During hyperventilation, she had one of her typical spells with clenching of her jaw and bilateral hands.  There was no epileptiform activity while this was occurring.  A second episode occurred later during the recording after she woke up.  Hyperventilation was associated with some slowing but quickly recovered upon normal breathing. EKG channel shows normal sinus rhythm.  She had drowsiness and briefly entered stage I sleep.Marland Kitchen  IMPRESSION: This EEG while awake and asleep shows the following: Two spells of muscle clenching were recorded and were not associated with epileptiform activity. The recording was otherwise normal.    INTERPRETING PHYSICIAN:   Wilhelmena Zea A. Epimenio Foot, MD, PhD, [facility] Certified in Neurology, Clinical Neurophysiology, Sleep Medicine, Pain Medicine and Neuroimaging  [facility] [address], [address] [place], [place] [address] [phone]

## 2021-04-04 ENCOUNTER — Telehealth: Payer: Self-pay | Admitting: Neurology

## 2021-04-04 NOTE — Telephone Encounter (Signed)
Called the patient and spoke with the mother. Advised that Dr Epimenio Foot reviewed the patient's EEG results and everything looks good. There was no concern for seizure like activity. Pt's mom verbalized understanding. She has asked is Dr Epimenio Foot feels that everything is ok from his standpoint for the patient to go back to work. Advised that given everything has been clear and she is established with a psychiatrist pt should be ok to resume work,driving. Advised I would confirm this with Dr Epimenio Foot and if she feels different, I will call back. She was appreciative for the care that has been provided for the pt.    Per Dr Epimenio Foot: Please let her know that the EEG looked good.  She did have 2 of her muscle tensing spells but they were not associated with any seizure activity.  Therefore, we do not need to start any medication.

## 2021-04-08 ENCOUNTER — Telehealth: Payer: Self-pay | Admitting: *Deleted

## 2021-04-08 ENCOUNTER — Encounter: Payer: Self-pay | Admitting: *Deleted

## 2021-04-08 NOTE — Telephone Encounter (Signed)
Called and spoke with mother (on [place]) about results per Dr. Bonnita Hollow note. She verbalized understanding. She wanted to know if there are any restrictions for her. Per note from CB,RN that there are no restrictions. She will like letter. Advised this will be ready today for her.

## 2021-04-08 NOTE — Telephone Encounter (Signed)
-----   Message from Asa Lente, MD sent at 04/01/2021  6:32 PM EST ----- Regarding: EEG Please let her know that the EEG looked good.  She did have 2 of her muscle tensing spells but they were not associated with any seizure activity.  Therefore, we do not need to start any medication.

## 2021-04-08 NOTE — Telephone Encounter (Addendum)
Called the mother back. There was no answer. Left a detailed message advising the mom that the patient would have no restrictions per Dr Epimenio Foot. Instructed the mother to call back with any questions or concerns.

## 2021-06-22 ENCOUNTER — Other Ambulatory Visit: Payer: Self-pay

## 2021-06-22 ENCOUNTER — Emergency Department
Admission: EM | Admit: 2021-06-22 | Discharge: 2021-06-24 | Disposition: A | Discharge status: Home / Self Care | Payer: Medicaid Other | Attending: Emergency Medicine | Admitting: Emergency Medicine

## 2021-06-22 DIAGNOSIS — U071 COVID-19: Secondary | ICD-10-CM | POA: Diagnosis not present

## 2021-06-22 DIAGNOSIS — R4586 Emotional lability: Secondary | ICD-10-CM | POA: Diagnosis not present

## 2021-06-22 DIAGNOSIS — F39 Unspecified mood [affective] disorder: Secondary | ICD-10-CM | POA: Diagnosis not present

## 2021-06-22 DIAGNOSIS — F32A Depression, unspecified: Secondary | ICD-10-CM

## 2021-06-22 DIAGNOSIS — F319 Bipolar disorder, unspecified: Secondary | ICD-10-CM | POA: Diagnosis not present

## 2021-06-22 DIAGNOSIS — R451 Restlessness and agitation: Secondary | ICD-10-CM | POA: Diagnosis present

## 2021-06-22 DIAGNOSIS — Z79899 Other long term (current) drug therapy: Secondary | ICD-10-CM | POA: Insufficient documentation

## 2021-06-22 DIAGNOSIS — D649 Anemia, unspecified: Secondary | ICD-10-CM | POA: Diagnosis not present

## 2021-06-22 DIAGNOSIS — E876 Hypokalemia: Secondary | ICD-10-CM

## 2021-06-22 DIAGNOSIS — R4689 Other symptoms and signs involving appearance and behavior: Secondary | ICD-10-CM

## 2021-06-22 LAB — CBC WITH DIFFERENTIAL/PLATELET
Abs Immature Granulocytes: 0.01 10*3/uL (ref 0.00–0.07)
Basophils Absolute: 0 10*3/uL (ref 0.0–0.1)
Basophils Relative: 0 %
Eosinophils Absolute: 0 10*3/uL (ref 0.0–0.5)
Eosinophils Relative: 1 %
HCT: 29 % — ABNORMAL LOW (ref 36.0–46.0)
Hemoglobin: 9.5 g/dL — ABNORMAL LOW (ref 12.0–15.0)
Immature Granulocytes: 0 %
Lymphocytes Relative: 37 %
Lymphs Abs: 1.1 10*3/uL (ref 0.7–4.0)
MCH: 27.9 pg (ref 26.0–34.0)
MCHC: 32.8 g/dL (ref 30.0–36.0)
MCV: 85.3 fL (ref 80.0–100.0)
Monocytes Absolute: 0.3 10*3/uL (ref 0.1–1.0)
Monocytes Relative: 11 %
Neutro Abs: 1.5 10*3/uL — ABNORMAL LOW (ref 1.7–7.7)
Neutrophils Relative %: 51 %
Platelets: 214 10*3/uL (ref 150–400)
RBC: 3.4 MIL/uL — ABNORMAL LOW (ref 3.87–5.11)
RDW: 15.8 % — ABNORMAL HIGH (ref 11.5–15.5)
WBC: 3 10*3/uL — ABNORMAL LOW (ref 4.0–10.5)
nRBC: 0 % (ref 0.0–0.2)

## 2021-06-22 LAB — COMPREHENSIVE METABOLIC PANEL
ALT: 16 U/L (ref 0–44)
AST: 25 U/L (ref 15–41)
Albumin: 3.7 g/dL (ref 3.5–5.0)
Alkaline Phosphatase: 43 U/L (ref 38–126)
Anion gap: 6 (ref 5–15)
BUN: 10 mg/dL (ref 6–20)
CO2: 24 mmol/L (ref 22–32)
Calcium: 8.5 mg/dL — ABNORMAL LOW (ref 8.9–10.3)
Chloride: 106 mmol/L (ref 98–111)
Creatinine, Ser: 0.66 mg/dL (ref 0.44–1.00)
GFR, Estimated: >60 mL/min (ref >60)
Glucose, Bld: 84 mg/dL (ref 70–99)
Potassium: 3.3 mmol/L — ABNORMAL LOW (ref 3.5–5.1)
Sodium: 136 mmol/L (ref 135–145)
Total Bilirubin: 0.7 mg/dL (ref 0.3–1.2)
Total Protein: 6.5 g/dL (ref 6.5–8.1)

## 2021-06-22 LAB — ETHANOL: Alcohol, Ethyl (B): <10 mg/dL (ref <10)

## 2021-06-22 LAB — RESP PANEL BY RT-PCR (FLU A&B, COVID) ARPGX2
Influenza A by PCR: NEGATIVE
Influenza B by PCR: NEGATIVE
SARS Coronavirus 2 by RT PCR: POSITIVE — AB

## 2021-06-22 MED ORDER — POTASSIUM CHLORIDE CRYS ER 20 MEQ PO TBCR
40.0000 meq | EXTENDED_RELEASE_TABLET | Freq: Once | ORAL | Status: AC
  Administered 2021-06-22: 40 meq via ORAL
  Filled 2021-06-22: qty 2

## 2021-06-22 MED ORDER — LACTATED RINGERS IV BOLUS
1000.0000 mL | Freq: Once | INTRAVENOUS | Status: AC
  Administered 2021-06-22: 1000 mL via INTRAVENOUS

## 2021-06-22 NOTE — ED Notes (Signed)
Mother informed needs to leave per policy. Mother and pt saying goodbye. Cellphone and car keys of pt handed to mother with pt permission. Mother will move car of pt which is still parked at her work place.

## 2021-06-22 NOTE — Consult Note (Signed)
Client has a history of bipolar d/o and appeared to be manic at a store.  EMS gave her Haldol and Versed.  She is not awake and on oxygen at this time, unable to assess.  Nanine Means, PMHNP

## 2021-06-22 NOTE — ED Notes (Signed)
ET CO2 applied. Reading 44 on monitor.

## 2021-06-22 NOTE — ED Notes (Signed)
Pt awake and asking for her mother. Mother stepped out of room to talk to TTS. Informed pt mother will come right back. Pt is calm at this time.

## 2021-06-22 NOTE — BH Assessment (Signed)
Comprehensive Clinical Assessment (CCA) Note  06/22/2021 Kristin Ingram [id number]  Chief Complaint:  Chief Complaint  Patient presents with   Behavioral Problem   Visit Diagnosis: Bipolar   Kristin Ingram is a 21 year old female who was brought to the ER via ems. Per the patient's mother Bethena Roys), the patient started displaying manic symptoms approximately a year ago. She would become fixated on things, wouldn't sleep for several days. This past October, she used some cannabis and it resulted in her having negative reaction. She started to have psychotic systems since then. She also had physical reaction that resulted in her having to a neurologist. She would say things that are delusional in nature. However, she would only say it to select people she trust. She would have moments when she was insightful. She'll tell her mother, what she was thinking or had said, didn't sound right or question if it was true. However, this last week, her psychotic systems have increased. She's responding to internal stimuli, she has turned off all electronics in the house, except the refrigerator. Mother also reports, she initially thought the patient was suffering with bipolar. She currently sees a psychiatrist for medications management. However, she admits to being in denial because she was ignoring the other psychotic systems because it wasn't happening as frequent as they are now. Her grandfather is dx as paranoid schizophrenic.  Information for the assessment was provided by the patient's mother (Edwina Matthew-[phone]).  CCA Screening, Triage and Referral (STR)  Patient Reported Information How did you hear about [place]? No data recorded What Is the Reason for Your Visit/Call Today? Patient brought in by EMS. Per the mother, she has mental health problems and started to decompensated.  How Long Has This Been Causing You Problems? 1-6 months  What Do You Feel Would Help You the  Most Today? Alcohol or Drug Use Treatment; Treatment for Depression or other mood problem   Have You Recently Had Any Thoughts About Hurting Yourself? No  Are You Planning to Commit Suicide/Harm Yourself At This time? No   Have you Recently Had Thoughts About [place]? No  Are You Planning to Harm Someone at This Time? No  Explanation: No data recorded  Have You Used Any Alcohol or Drugs in the Past 24 Hours? No  How Long Ago Did You Use Drugs or Alcohol? No data recorded What Did You Use and How Much? No data recorded  Do You Currently Have a Therapist/Psychiatrist? Yes  Name of Therapist/Psychiatrist: [facility]   Have You Been Recently Discharged From Any Office Practice or Programs? No  Explanation of Discharge From Practice/Program: No data recorded    CCA Screening Triage Referral Assessment Type of Contact: Face-to-Face  Telemedicine Service Delivery:   Is this Initial or Reassessment? No data recorded Date Telepsych consult ordered in [facility]:  No data recorded Time Telepsych consult ordered in [facility]:  No data recorded Location of Assessment: [facility] ED  Provider Location: [facility] ED   Collateral Involvement: Mother   Does Patient Have a [place]? No data recorded Name and Contact of Legal Guardian: No data recorded If Minor and Not Living with Parent(s), Who has Custody? No data recorded Is CPS involved or ever been involved? Never  Is APS involved or ever been involved? Never   Patient Determined To Be At Risk for Harm To Self or Others Based on Review of Patient Reported Information or Presenting Complaint? No  Method: No data recorded Availability of Means:  No data recorded Intent: No data recorded Notification Required: No data recorded Additional Information for Danger to Others Potential: No data recorded Additional Comments for Danger to Others Potential: No data recorded Are There Guns or Other Weapons in  Your Home? No data recorded Types of Guns/Weapons: No data recorded Are These Weapons Safely Secured?                            No data recorded Who Could Verify You Are Able To Have These Secured: No data recorded Do You Have any Outstanding Charges, Pending Court Dates, Parole/Probation? No data recorded Contacted To Inform of Risk of Harm To Self or Others: No data recorded   Does Patient Present under Involuntary Commitment? Yes  IVC Papers Initial File Date: 06/22/21   [place] of Residence: [place]   Patient Currently Receiving the Following Services: Medication Management   Determination of Need: Emergent (2 hours)   Options For Referral: Inpatient Hospitalization; ED Referral     CCA Biopsychosocial Patient Reported Schizophrenia/Schizoaffective Diagnosis in Past: No   Strengths: Have support system & some insight.   Mental Health Symptoms Depression:   Difficulty Concentrating; Hopelessness; Sleep (too much or little); Weight gain/loss   Duration of Depressive symptoms:  Duration of Depressive Symptoms: Greater than two weeks   Mania:   Increased Energy; Change in energy/activity; Racing thoughts; Recklessness; Overconfidence   Anxiety:    Difficulty concentrating; Restlessness; Worrying; Tension   Psychosis:   Delusions; Grossly disorganized speech; Hallucinations   Duration of Psychotic symptoms:  Duration of Psychotic Symptoms: Greater than six months   Trauma:   N/A   Obsessions:   Disrupts routine/functioning; Intrusive/time consuming   Compulsions:   Disrupts with routine/functioning   Inattention:   N/A   Hyperactivity/Impulsivity:   N/A   Oppositional/Defiant Behaviors:   N/A   Emotional Irregularity:   N/A   Other Mood/Personality Symptoms:  No data recorded   Mental Status Exam Appearance and self-care  Stature:   Average   Weight:   Average weight   Clothing:   Neat/clean; Age-appropriate   Grooming:    Normal   Cosmetic use:   Age appropriate   Posture/gait:   Normal   Motor activity:   -- (Within normal range)   Sensorium  Attention:   Normal   Concentration:   Normal   Orientation:   X5   Recall/memory:   Normal   Affect and Mood  Affect:   Full Range; Appropriate   Mood:   Depressed   Relating  Eye contact:  No data recorded  Facial expression:  No data recorded  Attitude toward examiner:  No data recorded  Thought and Language  Speech flow: No data recorded  Thought content:  No data recorded  Preoccupation:  No data recorded  Hallucinations:   Auditory; Visual (Per the mother)   Organization:  No data recorded  [employer] of Knowledge:   Fair (Per the mother)   Intelligence:   Average (Per the mother)   Abstraction:   Abstract (Per the mother)   Judgement:   Impaired (Per the mother)   Reality Testing:   Distorted (Per the mother)   Insight:   Poor; Fair (Per the mother)   Decision Making:   Impulsive (Per the mother)   Social Functioning  Social Maturity:   Impulsive (Per the mother)   Social Judgement:   Impropriety (Per the mother)   Stress  Stressors:   Other (Comment)   Coping Ability:   Deficient supports (Per the mother)   Skill Deficits:   -- (Per the mother)   Supports:   Family; Friends/Service system (Per the mother)     Religion: Religion/Spirituality Are You A Religious Person?: No  Leisure/Recreation: Leisure / Recreation Do You Have Hobbies?: No  Exercise/Diet: Exercise/Diet Do You Exercise?: No Have You Gained or Lost A Significant Amount of Weight in the Past Six Months?: No Do You Follow a Special Diet?: No Do You Have Any Trouble Sleeping?: No   CCA Employment/Education Employment/Work Situation: Employment / Work Situation Employment Situation: Employed Patient's Job has Been Impacted by Current Illness: Yes Has Patient ever Been in Passenger transport manager?:  No  Education: Education Is Patient Currently Attending School?: Yes School Currently Attending: [employer]) Did Physicist, medical?: No Did You Have An Individualized Education Program (IIEP): No Did You Have Any Difficulty At School?: No Patient's Education Has Been Impacted by Current Illness: No   CCA Family/Childhood History Family and Relationship History: Family history Marital status: Single Does patient have children?: No  Childhood History:  Childhood History By whom was/is the patient raised?: Both parents Did patient suffer any verbal/emotional/physical/sexual abuse as a child?: No Did patient suffer from severe childhood neglect?: No Has patient ever been sexually abused/assaulted/raped as an adolescent or adult?: No Was the patient ever a victim of a crime or a disaster?: No Witnessed domestic violence?: No Has patient been affected by domestic violence as an adult?: No  Child/Adolescent Assessment:     CCA Substance Use Alcohol/Drug Use: Alcohol / Drug Use Pain Medications: See PTA Prescriptions: See PTA Over the Counter: See PTA Longest period of sobriety (when/how long): Unable to quantify   ASAM's:  Six Dimensions of Multidimensional Assessment  Dimension 1:  Acute Intoxication and/or Withdrawal Potential:      Dimension 2:  Biomedical Conditions and Complications:      Dimension 3:  Emotional, Behavioral, or Cognitive Conditions and Complications:     Dimension 4:  Readiness to Change:     Dimension 5:  Relapse, Continued use, or Continued Problem Potential:     Dimension 6:  Recovery/Living Environment:     ASAM Severity Score:    ASAM Recommended Level of Treatment:     Substance use Disorder (SUD)    Recommendations for Services/Supports/Treatments:    Discharge Disposition:    DSM5 Diagnoses: Patient Active Problem List   Diagnosis Date Noted   Adjustment disorder with mixed anxiety and depressed mood    Migraine without aura  and without status migrainosus, not intractable 12/26/2015   Tension headache 12/26/2015   Vasovagal syncope 12/26/2015     Referrals to Alternative Service(s): Referred to Alternative Service(s):   Place:   Date:   Time:    Referred to Alternative Service(s):   Place:   Date:   Time:    Referred to Alternative Service(s):   Place:   Date:   Time:    Referred to Alternative Service(s):   Place:   Date:   Time:     Gunnar Fusi MS, LCAS, [facility], [facility] Therapeutic Triage Specialist 06/22/2021 6:02 PM

## 2021-06-22 NOTE — ED Notes (Signed)
Mother of pt, Sheral Flow, came to see pt and express her concerns over mental status deterioration of pt over past year. She has masters degree in Development worker, international aid and has worked in Diplomatic Services operational officer settings for 20 years. States that over past year, pt has become increasingly manic and they have had a few ER visits for manic behavioral problems over last several months.   Mother states that pt has hardly slept or eaten for last 2 weeks and that she seems to be "responding to some internal stimuli" as if hearing voices (turning head and laughing spontaneously, for example). States pt has also been saying that she is Jesus, her mother is the [place], and everyone else are [place] gods. Pt has been saying "I can predict the end of the world"  and "the birds are following me but protecting me".  Pt has PCP, neurologist and psychiatrist. Was on fluoxetine at some point but stopped taking due to paranoid beliefs about medication. Mother believes pt may have undiagnosed bipolar disorder with psychotic features and may need long acting injectable medication. Mother believes she has been having mini psychotic breaks and states that there is positive psychiatric history in their family, with pt's grandfather having schizophrenia and mother of pt having bipolar type 2.  Mother currently sitting quietly with pt.

## 2021-06-22 NOTE — ED Triage Notes (Signed)
Pt to ED via [facility], pt arrived to work at [employer] and was noted to be agitated and manic with pressured speech by Production designer, theatre/television/film. Manager was unable to talk pt down and called EMS. EMS was unable to calm pt down and pt began swinging at paramedics when they attempted to place her on stretcher. Pt was given 5mg  Haldol and 5mg  versed IM. Pt was noted to have intact airway after sedatives, and at this point VS were obtained. Mother arrived on scene and stated that pt has been noncompliant with psych meds and that pt has hx bipolar disorder.  Pt was given 20g IV to R hand and received NS by EMS.  EMS VS after sedation: CBG 96, RR 18-20, BP 85/40, etco2 38.  Pt placed in room on monitoring equipment. EKG and basic bloodwork obtained. Pt is NSR on monitor.

## 2021-06-22 NOTE — ED Notes (Signed)
Mother at bedside with pt. Pt is calm and cooperative at this time. ETCO2 removed.

## 2021-06-22 NOTE — ED Notes (Signed)
Pt resting in bed with eyes closed, VSS at this time. NAD noted at this time.

## 2021-06-22 NOTE — ED Provider Notes (Signed)
[facility] Provider Note    Event Date/Time   First MD Initiated Contact with Patient 06/22/21 1145     (approximate)   History   Behavioral Problem   HPI  Kristin Ingram is a 21 y.o. female with reportedly history of bipolar disorder per EMS who came into work today and was acting agitated with pressured speech prompting supervisor to call EMS.  On EMS arrival patient was quite apparently agitated and was unable to be verbally de-escalated.  She received 5 mg of IM Haldol and 5 mg of IM Versed prior to transfer.  She received 500 cc of normal saline with EMS.  On arrival she is fairly somnolent and unable to provide any additional history.      Physical Exam  Triage Vital Signs: ED Triage Vitals  Enc Vitals Group     BP      Pulse      Resp      Temp      Temp src      SpO2      Weight      Height      Head Circumference      Peak Flow      Pain Score      Pain Loc      Pain Edu?      Excl. in [place]?     Most recent vital signs: Vitals:   06/22/21 1415 06/22/21 1430  BP:  106/60  Pulse: 68 61  Resp: 13 15  Temp:    SpO2: 100% 100%    General: Sedated rolling head back-and-forth. CV:  Good peripheral perfusion.  2+ radial pulses.  No murmurs rubs or gallops Resp:  Normal effort.  Wheezing increased effort or evidence of distress. Abd:  No distention.  Soft throughout. Other:  Nipples are unremarkable outer.  Reactive to light.  Patient withdraws all extremities to noxious stimuli.   ED Results / Procedures / Treatments  Labs (all labs ordered are listed, but only abnormal results are displayed) Labs Reviewed  RESP PANEL BY RT-PCR (FLU A&B, COVID) ARPGX2 - Abnormal; Notable for the following components:      Result Value   SARS Coronavirus 2 by RT PCR POSITIVE (*)    All other components within normal limits  COMPREHENSIVE METABOLIC PANEL - Abnormal; Notable for the following components:   Potassium 3.3 (*)     Calcium 8.5 (*)    All other components within normal limits  CBC WITH DIFFERENTIAL/PLATELET - Abnormal; Notable for the following components:   WBC 3.0 (*)    RBC 3.40 (*)    Hemoglobin 9.5 (*)    HCT 29.0 (*)    RDW 15.8 (*)    Neutro Abs 1.5 (*)    All other components within normal limits  ETHANOL  URINE DRUG SCREEN, QUALITATIVE ([facility] ONLY)  POC URINE PREG, ED     EKG  EKG is normal sinus rhythm with a ventricular rate of 79, normal axis, unremarkable intervals with some J-point elevation and lead III and lateral leads without any other clear evidence of acute ischemia or significant arrhythmia   RADIOLOGY   PROCEDURES:  Critical Care performed: No  .1-3 Lead EKG Interpretation Performed by: Lucrezia Starch, MD Authorized by: Lucrezia Starch, MD     Interpretation: normal     ECG rate assessment: normal     Rhythm: sinus rhythm     Ectopy: none  Conduction: normal    The patient is on the cardiac monitor to evaluate for evidence of arrhythmia and/or significant heart rate changes.   MEDICATIONS ORDERED IN ED: Medications  potassium chloride SA (KLOR-CON M) CR tablet 40 mEq (has no administration in time range)  lactated ringers bolus 1,000 mL (1,000 mLs Intravenous New Bag/Given 06/22/21 1218)     IMPRESSION / MDM / ASSESSMENT AND PLAN / ED COURSE  I reviewed the triage vital signs and the nursing notes.                              Differential diagnosis includes, but is not limited to acute psychiatric decompensation, toxic ingestion, thyroid derangement and metabolic derangements.  No history or exam features to suggest significant trauma although patient will need to be closely evaluated because if she does not begin to wake up as expected after receiving Haldol and Versed she will need imaging of her head to rule out a subarachnoid hemorrhage or acute intracranial process  CMP is remarkable for potassium 3.8 without any other significant electrolyte  or metabolic derangements.  Serum ethanol is undetectable.  CBC shows WBC count of 3 hemoglobin 9.5 compared to 12 3 months ago without any other significant electrolyte or metabolic derangements.  His COVID test is positive.  When she is more awake we will need to reassess if she is having symptoms related to this and if she is actively recent we will start her on Paxlovid.   In the meantime psychiatry and TTS consulted.  The patient has been placed in psychiatric observation due to the need to provide a safe environment for the patient while obtaining psychiatric consultation and evaluation, as well as ongoing medical and medication management to treat the patient's condition.  The patient has not been placed under full IVC at this time.  While patient waiting to metabolize her Versed and Haldol her mother did arrive at bedside.  She informed me that patient was diagnosed with COVID 2 weeks ago and had only some mild congestion at this time but her cough symptoms have since resolved.  She states patient has a history of anxiety depression but is never been diagnosed with bipolar disorder or any other significant mood disorders.  States she is concerned patient may have undiagnosed mood disorder and that she is been acting more erratic the last couple days.       FINAL CLINICAL IMPRESSION(S) / ED DIAGNOSES   Final diagnoses:  Behavior concern  COVID  Hypokalemia  Anemia, unspecified type     Rx / DC Orders   ED Discharge Orders     None        Note:  This document was prepared using Dragon voice recognition software and may include unintentional dictation errors.   Lucrezia Starch, MD 06/22/21 1537

## 2021-06-22 NOTE — ED Notes (Signed)
Pt dressed into hospital attire per protocol. Pt dressed out by this Clinical research associate and Faith EDT. Pts belongings placed into belongings bag that labeled with pt name 1 of 1 that consists of:  1 black pants 1 blue shirt 1 black sport bra 1 blue underwear 6 earrings Keys Cell phone 1 pair of shoes 1 name badge 1 black jacket

## 2021-06-23 MED ORDER — FLUOXETINE HCL 20 MG PO CAPS
20.0000 mg | ORAL_CAPSULE | Freq: Every morning | ORAL | Status: DC
  Administered 2021-06-24: 20 mg via ORAL
  Filled 2021-06-23: qty 1

## 2021-06-23 MED ORDER — LORAZEPAM 1 MG PO TABS
1.0000 mg | ORAL_TABLET | ORAL | Status: DC | PRN
  Administered 2021-06-23: 1 mg via ORAL
  Filled 2021-06-23: qty 1

## 2021-06-23 NOTE — ED Notes (Signed)
This RN noticed that Pt had ring and several piercing still in place. Pt unable to get piercing out. Ring removed and placed in urine cup with chart label on it. Cup taken to [facility] and placed in pt's belongings bag. 1 gray ring

## 2021-06-23 NOTE — ED Provider Notes (Signed)
Emergency Medicine Observation Re-evaluation Note  Kristin Ingram is a 21 y.o. female, seen on rounds today.  Pt initially presented to the ED for complaints of Behavioral Problem Currently, the patient is resting.  Physical Exam  BP 109/65    Pulse 67    Temp 97.8 F (36.6 C) (Axillary)    Resp 18    Ht 1.626 m (5\' 4" )    Wt 54.4 kg    SpO2 99%    BMI 20.60 kg/m  Physical Exam Gen:  No acute distress Resp:  Breathing easily and comfortably, no accessory muscle usage Neuro:  Moving all four extremities, no gross focal neuro deficits Psych:  Resting currently, calm when awake  ED Course / MDM  EKG:   I have reviewed the labs performed to date as well as medications administered while in observation.  Recent changes in the last 24 hours include initial EDP assessment.  Plan  Current plan is for psychiatry evaluation.  Kristin Ingram is not under involuntary commitment.     , MD 06/23/21 [phone]

## 2021-06-23 NOTE — ED Notes (Signed)
Pt received lunch tray.  Pt received crayons and paper to write. Pt has mother visiting at this time. Mother states pt tested positive for COVID 3 weeks ago and only shows signs of runny nose now. Not sure if she actually has the virus again truly

## 2021-06-23 NOTE — ED Notes (Signed)
Pt was given snack and drink 

## 2021-06-23 NOTE — ED Notes (Signed)
Mother in to visit with pt. Informed pt positive for covid and can wear mask if wants to visit. She verbalized understanding.

## 2021-06-23 NOTE — ED Notes (Signed)
This RN to bedside, pt awakens easily, noted to be repositioning herself in bed. Pt denies any needs at this time. Pt responds appropriately to questions asked.

## 2021-06-23 NOTE — ED Notes (Signed)
Pt given dinner  

## 2021-06-23 NOTE — ED Notes (Signed)
Pt received breakfast tray at this time

## 2021-06-24 DIAGNOSIS — R4586 Emotional lability: Secondary | ICD-10-CM | POA: Diagnosis not present

## 2021-06-24 LAB — URINE DRUG SCREEN, QUALITATIVE (ARMC ONLY)
Amphetamines, Ur Screen: NOT DETECTED
Barbiturates, Ur Screen: NOT DETECTED
Benzodiazepine, Ur Scrn: POSITIVE — AB
Cannabinoid 50 Ng, Ur ~~LOC~~: POSITIVE — AB
Cocaine Metabolite,Ur ~~LOC~~: NOT DETECTED
MDMA (Ecstasy)Ur Screen: NOT DETECTED
Methadone Scn, Ur: NOT DETECTED
Opiate, Ur Screen: NOT DETECTED
Phencyclidine (PCP) Ur S: NOT DETECTED
Tricyclic, Ur Screen: NOT DETECTED

## 2021-06-24 LAB — POC URINE PREG, ED: Preg Test, Ur: NEGATIVE

## 2021-06-24 NOTE — ED Notes (Signed)
Hospital meal provided.  100% consumed, pt tolerated w/o complaints.  Waste discarded appropriately.   

## 2021-06-24 NOTE — ED Notes (Signed)
Psych NP at bedside

## 2021-06-24 NOTE — ED Notes (Signed)
With permission of patient, mother updated. Name       Kristin Ingram Mother [phone]   [phone]

## 2021-06-24 NOTE — ED Notes (Deleted)
[facility]  CONSULT  DONE   

## 2021-06-24 NOTE — ED Notes (Addendum)
Pt sleeping at this time will shower later

## 2021-06-24 NOTE — ED Provider Notes (Signed)
Patient seen by psychiatry and cleared for discharge.  Discharged in stable condition.   Gilles Chiquito, MD 06/24/21 [phone]
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
## 2021-06-24 NOTE — ED Notes (Signed)
VOL  CONSULT  DONE   

## 2021-06-24 NOTE — ED Notes (Signed)
VOL/pending psych consult 

## 2021-06-24 NOTE — ED Provider Notes (Signed)
Emergency Medicine Observation Re-evaluation Note  Kristin Ingram is a 21 y.o. female, seen on rounds today.  Pt initially presented to the ED for complaints of Behavioral Problem Currently, the patient is resting.  Physical Exam  BP 103/74 (BP Location: Left Arm)    Pulse (!) 107    Temp 98 F (36.7 C) (Oral)    Resp 18    Ht 5\' 4"  (1.626 m)    Wt 54.4 kg    SpO2 100%    BMI 20.60 kg/m  Physical Exam Gen: No acute distress  Resp: Normal rise and fall of chest Neuro: Moving all four extremities Psych: Resting currently, calm and cooperative when awake     ED Course / MDM  EKG:   I have reviewed the labs performed to date as well as medications administered while in observation.  Recent changes in the last 24 hours include no acute events overnight.  Plan  Current plan is for psychiatric disposition.  Kristin Ingram is not under involuntary commitment.     Santa Abdelrahman, , DO 06/24/21 [phone]

## 2021-06-24 NOTE — Discharge Instructions (Addendum)
Please note you were found to be slightly anemic today.  You should follow this up with your primary care physician listed in the chart or primary care physician of your choosing.  In addition recommend he follow-up with an outpatient psychiatric provider to discussed presentation leading to your emergency room visit.  I will place information in your chart for [facility] which is a walk-in psychiatric clinic although you may obtain a referral to any other providers from your PCP as well.

## 2021-10-27 ENCOUNTER — Other Ambulatory Visit: Payer: Self-pay

## 2021-10-27 ENCOUNTER — Encounter (HOSPITAL_COMMUNITY): Payer: Self-pay | Admitting: Emergency Medicine

## 2021-10-27 ENCOUNTER — Ambulatory Visit (HOSPITAL_COMMUNITY)
Admission: EM | Admit: 2021-10-27 | Discharge: 2021-10-27 | Disposition: A | Discharge status: Home / Self Care | Payer: Medicaid Other | Attending: Emergency Medicine | Admitting: Emergency Medicine

## 2021-10-27 ENCOUNTER — Ambulatory Visit (HOSPITAL_COMMUNITY)
Admission: EM | Admit: 2021-10-27 | Discharge: 2021-10-27 | Discharge status: Against Medical Advice | Payer: Medicaid Other

## 2021-10-27 DIAGNOSIS — N3090 Cystitis, unspecified without hematuria: Secondary | ICD-10-CM | POA: Diagnosis present

## 2021-10-27 LAB — POCT URINALYSIS DIPSTICK, ED / UC
Glucose, UA: 100 mg/dL — AB
Nitrite: POSITIVE — AB
Protein, ur: 100 mg/dL — AB
Specific Gravity, Urine: 1.01 (ref 1.005–1.030)
Urobilinogen, UA: 2 mg/dL — ABNORMAL HIGH (ref 0.0–1.0)
pH: 5 (ref 5.0–8.0)

## 2021-10-27 MED ORDER — CEPHALEXIN 500 MG PO CAPS: 500.0000 mg | ORAL_CAPSULE | Freq: Two times a day (BID) | ORAL | 0 refills | Status: AC

## 2021-10-27 NOTE — ED Notes (Signed)
No answer from lobby  

## 2021-10-27 NOTE — ED Triage Notes (Signed)
Patient reports having a uti recently.  Patient took all the medicine, but not as instructed.  Patient complains of burning with urination, frequent urination.

## 2021-10-27 NOTE — ED Notes (Signed)
Called patient on phone to let know room ready, pt states will be 10 minutes or so bc left to get food. This RN told pt that would have to check back as she left premises.

## 2021-10-27 NOTE — ED Notes (Signed)
Urine in lab 

## 2021-10-27 NOTE — ED Provider Notes (Signed)
[facility]    CSN: [id number] Arrival date & time: 10/27/21  1536     History   Chief Complaint Chief Complaint  Patient presents with   Urinary Tract Infection    HPI Kristin Ingram is a 21 y.o. female.  Presents with dysuria and discomfort in the bladder when urinating.  She was evaluated 2 weeks ago at her primary care and diagnosed with a UTI.  She was put on Bactrim for 3 days.  She only took the medicine for 2 days because her symptoms improved.  States recently all of her symptoms returned.  She has been taking Azo for the pain.  Currently on her menstrual cycle. Denies fever or chills, abdominal pain, back pain, flank pain, hematuria, vaginal symptoms.  Past Medical History:  Diagnosis Date   Anxiety    Asthma    Depression    Heart murmur    Syncope    Walking pneumonia     Patient Active Problem List   Diagnosis Date Noted   Mood disturbance 06/24/2021   Adjustment disorder with mixed anxiety and depressed mood    Migraine without aura and without status migrainosus, not intractable 12/26/2015   Tension headache 12/26/2015   Vasovagal syncope 12/26/2015    History reviewed. No pertinent surgical history.  OB History   No obstetric history on file.      Home Medications    Prior to Admission medications   Medication Sig Start Date End Date Taking? Authorizing Provider  cephALEXin (KEFLEX) 500 MG capsule Take 1 capsule (500 mg total) by mouth in the morning and at bedtime for 7 days. 10/27/21 11/03/21 Yes Aodhan Scheidt, Lurena Joiner, PA-C  Phenazopyridine HCl (AZO TABS PO) Take by mouth.   Yes [provider]  FLUoxetine (PROZAC) 20 MG capsule Take 1 capsule (20 mg total) by mouth every morning. 03/28/21   Sater, Pearletha Furl, MD  Multiple Vitamins-Minerals (MULTIVITAMIN WITH MINERALS) tablet Take 1 tablet by mouth daily.    [provider]  QUEtiapine (SEROQUEL) 50 MG tablet  10/16/21   [provider]    Family  History Family History  Problem Relation Age of Onset   Heart failure Mother    Heart Problems Mother    Hypertrophic cardiomyopathy Maternal Uncle    Parkinson's disease Maternal Grandmother    Marfan syndrome Maternal Grandmother    Heart attack Maternal Grandfather    Transient ischemic attack Maternal Grandfather     Social History Social History   Tobacco Use   Smoking status: Never   Smokeless tobacco: Never  Vaping Use   Vaping Use: Never used  Substance Use Topics   Alcohol use: Yes   Drug use: Yes    Types: Marijuana     Allergies   Amoxicillin   Review of Systems Review of Systems Per HPI  Physical Exam Triage Vital Signs ED Triage Vitals  Enc Vitals Group     BP 10/27/21 1623 (!) 93/50     Pulse Rate 10/27/21 1623 73     Resp 10/27/21 1623 16     Temp 10/27/21 1623 98.8 F (37.1 C)     Temp Source 10/27/21 1623 Oral     SpO2 10/27/21 1623 96 %     Weight --      Height --      Head Circumference --      Peak Flow --      Pain Score 10/27/21 1620 8     Pain Loc --  Pain Edu? --      Excl. in GC? --    No data found.  Updated Vital Signs BP (!) 93/50 (BP Location: Left Arm)   Pulse 73   Temp 98.8 F (37.1 C) (Oral)   Resp 16   LMP 10/27/2021   SpO2 96%    Physical Exam Vitals and nursing note reviewed.  Constitutional:      General: She is not in acute distress. HENT:     Mouth/Throat:     Mouth: Mucous membranes are moist.     Pharynx: Oropharynx is clear.  Eyes:     Conjunctiva/sclera: Conjunctivae normal.     Pupils: Pupils are equal, round, and reactive to light.  Cardiovascular:     Rate and Rhythm: Normal rate and regular rhythm.     Heart sounds: Normal heart sounds.  Pulmonary:     Effort: Pulmonary effort is normal.     Breath sounds: Normal breath sounds.  Abdominal:     General: Bowel sounds are normal.     Palpations: Abdomen is soft.     Tenderness: There is no abdominal tenderness. There is no right CVA  tenderness or left CVA tenderness.  Musculoskeletal:        General: Normal range of motion.  Skin:    Findings: No rash.  Neurological:     Mental Status: She is alert and oriented to person, place, and time.    UC Treatments / Results  Labs (all labs ordered are listed, but only abnormal results are displayed) Labs Reviewed  POCT URINALYSIS DIPSTICK, ED / UC - Abnormal; Notable for the following components:      Result Value   Glucose, UA 100 (*)    Bilirubin Urine SMALL (*)    Ketones, ur TRACE (*)    Hgb urine dipstick MODERATE (*)    Protein, ur 100 (*)    Urobilinogen, UA 2.0 (*)    Nitrite POSITIVE (*)    Leukocytes,Ua LARGE (*)    All other components within normal limits  URINE CULTURE   EKG  Radiology No results found.  Procedures Procedures (including critical care time)  Medications Ordered in UC Medications - No data to display  Initial Impression / Assessment and Plan / UC Course  I have reviewed the triage vital signs and the nursing notes.  Pertinent labs & imaging results that were available during my care of the patient were reviewed by me and considered in my medical decision making (see chart for details).  Patient urinalysis in clinic today lit up for almost everything.  She has been taking Azo which may interfere with urine dipstick.  Based on her symptoms I am going to treat her with antibiotic.  We will do Keflex twice daily for 7 days.  I discussed with patient importance to continue the medicine for the full 7 days even if her symptoms improve.  Patient voices understanding.  We discussed return precautions.  Patient is discharged in stable condition.  Final Clinical Impressions(s) / UC Diagnoses   Final diagnoses:  Cystitis     Discharge Instructions      Please take the antibiotic twice daily for a full 7 days.  Please return to the urgent care or emergency department if symptoms worsen or do not improve.    ED Prescriptions      Medication Sig Dispense Auth. Provider   cephALEXin (KEFLEX) 500 MG capsule Take 1 capsule (500 mg total) by mouth in the morning and  at bedtime for 7 days. 14 capsule Paige Monarrez, Lurena Joinerebecca, PA-C      PDMP not reviewed this encounter.   Earle Burson, Lurena JoinerRebecca, [place]PA-C 10/27/21 1714

## 2021-10-27 NOTE — Discharge Instructions (Signed)
Please take the antibiotic twice daily for a full 7 days.  Please return to the urgent care or emergency department if symptoms worsen or do not improve.

## 2021-10-28 LAB — URINE CULTURE: Culture: >=100000 — AB

## 2022-02-27 ENCOUNTER — Ambulatory Visit (HOSPITAL_COMMUNITY): Payer: Medicaid Other

## 2022-07-29 ENCOUNTER — Encounter: Payer: Self-pay | Admitting: Gastroenterology

## 2022-09-04 ENCOUNTER — Encounter: Payer: Self-pay | Admitting: *Deleted

## 2022-09-05 ENCOUNTER — Ambulatory Visit (INDEPENDENT_AMBULATORY_CARE_PROVIDER_SITE_OTHER): Payer: Medicaid Other | Admitting: Gastroenterology

## 2022-09-05 ENCOUNTER — Other Ambulatory Visit (INDEPENDENT_AMBULATORY_CARE_PROVIDER_SITE_OTHER): Payer: Medicaid Other

## 2022-09-05 ENCOUNTER — Encounter: Payer: Self-pay | Admitting: Gastroenterology

## 2022-09-05 VITALS — BP 98/68 | HR 98 | Ht 64.0 in | Wt 140.0 lb

## 2022-09-05 DIAGNOSIS — R197 Diarrhea, unspecified: Secondary | ICD-10-CM | POA: Diagnosis not present

## 2022-09-05 DIAGNOSIS — R109 Unspecified abdominal pain: Secondary | ICD-10-CM

## 2022-09-05 DIAGNOSIS — K625 Hemorrhage of anus and rectum: Secondary | ICD-10-CM | POA: Diagnosis not present

## 2022-09-05 LAB — SEDIMENTATION RATE: Sed Rate: 8 mm/hr (ref 0–20)

## 2022-09-05 LAB — CBC WITH DIFFERENTIAL/PLATELET
Basophils Absolute: 0 10*3/uL (ref 0.0–0.1)
Basophils Relative: 0.4 % (ref 0.0–3.0)
Eosinophils Absolute: 0.1 10*3/uL (ref 0.0–0.7)
Eosinophils Relative: 0.9 % (ref 0.0–5.0)
HCT: 35.9 % — ABNORMAL LOW (ref 36.0–46.0)
Hemoglobin: 11.9 g/dL — ABNORMAL LOW (ref 12.0–15.0)
Lymphocytes Relative: 26.3 % (ref 12.0–46.0)
Lymphs Abs: 1.4 10*3/uL (ref 0.7–4.0)
MCHC: 33.2 g/dL (ref 30.0–36.0)
MCV: 86.9 fl (ref 78.0–100.0)
Monocytes Absolute: 0.4 10*3/uL (ref 0.1–1.0)
Monocytes Relative: 7.5 % (ref 3.0–12.0)
Neutro Abs: 3.6 10*3/uL (ref 1.4–7.7)
Neutrophils Relative %: 64.9 % (ref 43.0–77.0)
Platelets: 205 10*3/uL (ref 150.0–400.0)
RBC: 4.14 Mil/uL (ref 3.87–5.11)
RDW: 15 % (ref 11.5–15.5)
WBC: 5.5 10*3/uL (ref 4.0–10.5)

## 2022-09-05 LAB — COMPREHENSIVE METABOLIC PANEL
ALT: 6 U/L (ref 0–35)
AST: 14 U/L (ref 0–37)
Albumin: 4.6 g/dL (ref 3.5–5.2)
Alkaline Phosphatase: 46 U/L (ref 39–117)
BUN: 11 mg/dL (ref 6–23)
CO2: 29 mEq/L (ref 19–32)
Calcium: 9.4 mg/dL (ref 8.4–10.5)
Chloride: 101 mEq/L (ref 96–112)
Creatinine, Ser: 0.76 mg/dL (ref 0.40–1.20)
GFR: 111.24 mL/min (ref >60.00)
Glucose, Bld: 84 mg/dL (ref 70–99)
Potassium: 3.6 mEq/L (ref 3.5–5.1)
Sodium: 138 mEq/L (ref 135–145)
Total Bilirubin: 0.7 mg/dL (ref 0.2–1.2)
Total Protein: 7.9 g/dL (ref 6.0–8.3)

## 2022-09-05 LAB — URINALYSIS, ROUTINE W REFLEX MICROSCOPIC
Bilirubin Urine: NEGATIVE
Hgb urine dipstick: NEGATIVE
Ketones, ur: NEGATIVE
Leukocytes,Ua: NEGATIVE
Nitrite: NEGATIVE
RBC / HPF: NONE SEEN (ref 0–?)
Specific Gravity, Urine: 1.02 (ref 1.000–1.030)
Total Protein, Urine: NEGATIVE
Urine Glucose: NEGATIVE
Urobilinogen, UA: 1 (ref 0.0–1.0)
WBC, UA: NONE SEEN (ref 0–?)
pH: 7 (ref 5.0–8.0)

## 2022-09-05 LAB — HIGH SENSITIVITY CRP: CRP, High Sensitivity: 1.52 mg/L (ref 0.000–5.000)

## 2022-09-05 LAB — TSH: TSH: 0.8 u[IU]/mL (ref 0.35–5.50)

## 2022-09-05 MED ORDER — NA SULFATE-K SULFATE-MG SULF 17.5-3.13-1.6 GM/177ML PO SOLN: ORAL | 0 refills | Status: DC

## 2022-09-05 NOTE — Patient Instructions (Addendum)
Your provider has requested that you go to the basement level for lab work before leaving today. Press "B" on the elevator. The lab is located at the first door on the left as you exit the elevator.  _____________________________________________________ Bonita Quin have been scheduled for a colonoscopy. Please follow written instructions given to you at your visit today.  Please pick up your prep supplies at the pharmacy within the next 1-3 days. If you use inhalers (even only as needed), please bring them with you on the day of your procedure.  _______________________________________________________  If your blood pressure at your visit was 140/90 or greater, please contact your primary care physician to follow up on this.  _______________________________________________________  If you are age 23 or older, your body mass index should be between 23-30. Your Body mass index is 24.03 kg/m. If this is out of the aforementioned range listed, please consider follow up with your Primary Care Provider.  If you are age 57 or younger, your body mass index should be between 19-25. Your Body mass index is 24.03 kg/m. If this is out of the aformentioned range listed, please consider follow up with your Primary Care Provider.   ________________________________________________________  The [facility] providers would like to encourage you to use [facility] to communicate with providers for non-urgent requests or questions.  Due to long hold times on the telephone, sending your provider a message by [facility] may be a faster and more efficient way to get a response.  Please allow 48 business hours for a response.  Please remember that this is for non-urgent requests.  _______________________________________________________  Due to recent changes in healthcare laws, you may see the results of your imaging and laboratory studies on MyChart before your provider has had a chance to review them.  We understand that in some  cases there may be results that are confusing or concerning to you. Not all laboratory results come back in the same time frame and the provider may be waiting for multiple results in order to interpret others.  Please give [place] 48 hours in order for your provider to thoroughly review all the results before contacting the office for clarification of your results.

## 2022-09-05 NOTE — Progress Notes (Signed)
09/05/2022 Kristin Ingram [id number] 02/23/2001   HISTORY OF PRESENT ILLNESS: This is a 22 year old female who is new to our office.  She has been referred here by Dr. Donzetta Kohut for complaints of abdominal pain.  Patient tells me that she has had pain on both sides of her abdomen that has been present for about the past year.  She says it used to come and go for but for the past 2 to 3 months it has been more consistent.  She describes them as sharp pains at times that take her breath away.  She has has had bouts of watery diarrhea that she describes as having a bad smell to it, but then will have normal stools following that.  She has seen some bright red blood on the toilet paper on occasion with wiping.  She denies any nausea, vomiting, bloating, constipation.  Maybe occasional heartburn or reflux, but nothing significant.  No NSAID use.  Says for the past month or so she has had a hard time initiating her urination.  She had a CT angio of the abdomen and pelvis in July 2022 for complaints of abdominal pain that was unremarkable.  No known family history of any gastrointestinal problems.   Past Medical History:  Diagnosis Date   Anxiety    Asthma    Depression    Heart murmur    IDA (iron deficiency anemia)    Seizure-like activity    per neuro, pseudoseizures   Syncope    Walking pneumonia    History reviewed. No pertinent surgical history.  reports that she has never smoked. She has never used smokeless tobacco. She reports that she does not currently use alcohol. She reports current drug use. Drug: Marijuana. family history includes Heart Problems in her mother; Heart attack in her maternal grandfather; Heart failure in her mother; Hypertrophic cardiomyopathy in her maternal uncle; Marfan syndrome in her maternal grandmother; Parkinson's disease in her maternal grandmother; Transient ischemic attack in her maternal grandfather. Allergies  Allergen Reactions    Amoxicillin Dermatitis, Rash and Other (See Comments)    Childhood       Outpatient Encounter Medications as of 09/05/2022  Medication Sig   FLUoxetine (PROZAC) 20 MG capsule Take 1 capsule (20 mg total) by mouth every morning.   gabapentin (NEURONTIN) 100 MG capsule Take 100 mg by mouth 3 (three) times daily.   Phenazopyridine HCl (AZO TABS PO) Take by mouth.   QUEtiapine (SEROQUEL) 50 MG tablet    Multiple Vitamins-Minerals (MULTIVITAMIN WITH MINERALS) tablet Take 1 tablet by mouth daily. (Patient not taking: Reported on 09/05/2022)   No facility-administered encounter medications on file as of 09/05/2022.     REVIEW OF SYSTEMS  : All other systems reviewed and negative except where noted in the History of Present Illness.   PHYSICAL EXAM: BP 98/68   Pulse 98   Ht  (1.626 m)   Wt 140 lb (63.5 kg)   BMI 24.03 kg/m  General: Well developed female in no acute distress Head: Normocephalic and atraumatic Eyes:  Sclerae anicteric, conjunctiva pink. Ears: Normal auditory acuity Lungs: Clear throughout to auscultation; no W/R/R. Heart: Regular rate and rhythm; no M/R/G. Abdomen: Soft, non-distended.  BS present.  Mild diffuse TTP. Rectal:  Will be done at the time of colonoscopy. Musculoskeletal: Symmetrical with no gross deformities  Skin: No lesions on visible extremities Extremities: No edema  Neurological: Alert oriented x 4, grossly non-focal Psychological:  Alert and cooperative. Normal  mood and affect  ASSESSMENT AND PLAN: *22 year old female with some very nonspecific GI symptoms including bilateral abdominal pains for the last year, a few episodes of bright red blood on the toilet paper upon wiping, and intermittent watery diarrhea.  CT scan for complaints of abdominal pain in July 2022 was unremarkable.  Not really sure what to make of her symptoms.  Could be IBS.  Will check labs including a CBC, CMP, TSH, sed rate, CRP, celiac labs, and also urinalysis.  Will plan  for colonoscopy as well to rule out IBD.  This is being scheduled Dr. Barron Alvine.  The risks, benefits, and alternatives to colonoscopy were discussed with the patient and she consents to proceed.   CC:  Stevphen Rochester, MD

## 2022-09-07 LAB — URINE CULTURE
MICRO NUMBER:: 14818042
Result:: NO GROWTH
SPECIMEN QUALITY:: ADEQUATE

## 2022-09-07 LAB — IGA: Immunoglobulin A: 125 mg/dL (ref 47–310)

## 2022-09-07 LAB — TISSUE TRANSGLUTAMINASE, IGA: (tTG) Ab, IgA: <1 U/mL

## 2022-09-17 NOTE — Progress Notes (Signed)
Agree with the assessment and plan as outlined by Jessica Zehr, PA-C. ? ?Jemeka Wagler, DO, FACG ? ?

## 2022-10-08 ENCOUNTER — Ambulatory Visit (AMBULATORY_SURGERY_CENTER): Payer: Medicaid Other | Admitting: Gastroenterology

## 2022-10-08 ENCOUNTER — Encounter: Payer: Self-pay | Admitting: Gastroenterology

## 2022-10-08 ENCOUNTER — Emergency Department (HOSPITAL_COMMUNITY)
Admission: EM | Admit: 2022-10-08 | Discharge: 2022-10-08 | Disposition: A | Discharge status: Home / Self Care | Payer: Medicaid Other | Attending: Emergency Medicine | Admitting: Emergency Medicine

## 2022-10-08 ENCOUNTER — Other Ambulatory Visit: Payer: Self-pay

## 2022-10-08 ENCOUNTER — Encounter (HOSPITAL_COMMUNITY): Payer: Self-pay

## 2022-10-08 VITALS — BP 116/60 | HR 65 | Temp 98.4°F | Resp 12 | Ht 64.0 in | Wt 140.0 lb

## 2022-10-08 DIAGNOSIS — K625 Hemorrhage of anus and rectum: Secondary | ICD-10-CM

## 2022-10-08 DIAGNOSIS — R197 Diarrhea, unspecified: Secondary | ICD-10-CM

## 2022-10-08 DIAGNOSIS — K635 Polyp of colon: Secondary | ICD-10-CM

## 2022-10-08 DIAGNOSIS — D649 Anemia, unspecified: Secondary | ICD-10-CM | POA: Diagnosis not present

## 2022-10-08 DIAGNOSIS — R194 Change in bowel habit: Secondary | ICD-10-CM | POA: Diagnosis not present

## 2022-10-08 DIAGNOSIS — R569 Unspecified convulsions: Secondary | ICD-10-CM | POA: Diagnosis present

## 2022-10-08 DIAGNOSIS — D127 Benign neoplasm of rectosigmoid junction: Secondary | ICD-10-CM

## 2022-10-08 DIAGNOSIS — R109 Unspecified abdominal pain: Secondary | ICD-10-CM

## 2022-10-08 DIAGNOSIS — K640 First degree hemorrhoids: Secondary | ICD-10-CM

## 2022-10-08 LAB — CBC
HCT: 32.8 % — ABNORMAL LOW (ref 36.0–46.0)
Hemoglobin: 10.4 g/dL — ABNORMAL LOW (ref 12.0–15.0)
MCH: 28.4 pg (ref 26.0–34.0)
MCHC: 31.7 g/dL (ref 30.0–36.0)
MCV: 89.6 fL (ref 80.0–100.0)
Platelets: 173 10*3/uL (ref 150–400)
RBC: 3.66 MIL/uL — ABNORMAL LOW (ref 3.87–5.11)
RDW: 14.6 % (ref 11.5–15.5)
WBC: 4.3 10*3/uL (ref 4.0–10.5)
nRBC: 0 % (ref 0.0–0.2)

## 2022-10-08 LAB — BASIC METABOLIC PANEL
Anion gap: 6 (ref 5–15)
BUN: 11 mg/dL (ref 6–20)
CO2: 25 mmol/L (ref 22–32)
Calcium: 8.6 mg/dL — ABNORMAL LOW (ref 8.9–10.3)
Chloride: 108 mmol/L (ref 98–111)
Creatinine, Ser: 0.74 mg/dL (ref 0.44–1.00)
GFR, Estimated: >60 mL/min (ref >60)
Glucose, Bld: 97 mg/dL (ref 70–99)
Potassium: 4.1 mmol/L (ref 3.5–5.1)
Sodium: 139 mmol/L (ref 135–145)

## 2022-10-08 LAB — I-STAT BETA HCG BLOOD, ED (MC, WL, AP ONLY): I-stat hCG, quantitative: <5 m[IU]/mL (ref <5)

## 2022-10-08 MED ORDER — SODIUM CHLORIDE 0.9 % IV SOLN
500.0000 mL | Freq: Once | INTRAVENOUS | Status: DC
Start: 2022-10-08 — End: 2022-10-08

## 2022-10-08 NOTE — Progress Notes (Unsigned)
Post procedure update note:  Colonoscopy completed with out difficulty. Did well from sedation standpoint throughout the procedure, without any hemodynamic instability. Was recovering in PACU, slowly, but appropriately waking from sedation and conversing with nursing staff. Then developed acute onset contractions of the b/l upper extremities and clenching of the hands, with rigid contractions of the b/l lower extremities. Contractions slowly resolved, with brief eye opening, but not conversing. Contractions continued to recur in the PACU. Sxs lasted up to a minute or so, then abate. She remained HD stable the entire time.   Discussed with her mother at bedside. She reports a history of similar sxs in the past, including an in depth Neurologic work-up in 2022. This included a normal EEG (despite having 2 of these contractions during the study), MRI brain, labs, and follow-up in the Neurology Clinic.   Will plan for transport via EMS to [facility] ER for further evaluation. Report called to the ER. I discussed with her mother at bedside and she agrees. Report given to EMS at bedside prior to transport.    Doristine Locks, DO, [facility]

## 2022-10-08 NOTE — Patient Instructions (Signed)
Please read handouts provided. Continue present medications. Await pathology results. Consider a fiber supplement. Follow-up with Doug Sou, PA-C in the GI clinic as needed. Consider repeat colonoscopy at age 22 years.  YOU HAD AN ENDOSCOPIC PROCEDURE TODAY AT THE [facility]:   Refer to the procedure report that was given to you for any specific questions about what was found during the examination.  If the procedure report does not answer your questions, please call your gastroenterologist to clarify.  If you requested that your care partner not be given the details of your procedure findings, then the procedure report has been included in a sealed envelope for you to review at your convenience later.  YOU SHOULD EXPECT: Some feelings of bloating in the abdomen. Passage of more gas than usual.  Walking can help get rid of the air that was put into your GI tract during the procedure and reduce the bloating. If you had a lower endoscopy (such as a colonoscopy or flexible sigmoidoscopy) you may notice spotting of blood in your stool or on the toilet paper. If you underwent a bowel prep for your procedure, you may not have a normal bowel movement for a few days.  Please Note:  You might notice some irritation and congestion in your nose or some drainage.  This is from the oxygen used during your procedure.  There is no need for concern and it should clear up in a day or so.  SYMPTOMS TO REPORT IMMEDIATELY:  Following lower endoscopy (colonoscopy or flexible sigmoidoscopy):  Excessive amounts of blood in the stool  Significant tenderness or worsening of abdominal pains  Swelling of the abdomen that is new, acute  Fever of 100F or higher.  For urgent or emergent issues, a gastroenterologist can be reached at any hour by calling [phone]. Do not use MyChart messaging for urgent concerns.    DIET:  We do recommend a small meal at first, but then you may proceed to your  regular diet.  Drink plenty of fluids but you should avoid alcoholic beverages for 24 hours.  ACTIVITY:  You should plan to take it easy for the rest of today and you should NOT DRIVE or use heavy machinery until tomorrow (because of the sedation medicines used during the test).    FOLLOW UP: Our staff will call the number listed on your records the next business day following your procedure.  We will call around 7:15- 8:00 am to check on you and address any questions or concerns that you may have regarding the information given to you following your procedure. If we do not reach you, we will leave a message.     If any biopsies were taken you will be contacted by phone or by letter within the next 1-3 weeks.  Please call us at [phone] if you have not heard about the biopsies in 3 weeks.    SIGNATURES/CONFIDENTIALITY: You and/or your care partner have signed paperwork which will be entered into your electronic medical record.  These signatures attest to the fact that that the information above on your After Visit Summary has been reviewed and is understood.  Full responsibility of the confidentiality of this discharge information lies with you and/or your care-partner.

## 2022-10-08 NOTE — Progress Notes (Unsigned)
GASTROENTEROLOGY PROCEDURE H&P NOTE   Primary Care Physician: Stevphen Rochester, MD    Reason for Procedure:  Abdominal pain, change in bowel, diarrhea, BRBPR  Plan:    ,  Patient is appropriate for endoscopic procedure(s) in the ambulatory ([facility]) setting.  The nature of the procedure, as well as the risks, benefits, and alternatives were carefully and thoroughly reviewed with the patient. Ample time for discussion and questions allowed. The patient understood, was satisfied, and agreed to proceed.     HPI: Kristin Ingram is a 22 y.o. female who presents for colonoscopy for evaluation of intermittent generalized abdominal pain for the last 1+ year.  Symptoms seem to be more consistent over the last 3-4 months.  Also with intermittent watery, nonbloody stools.  Does have episodes of BRB on tissue paper.  Evaluation to date has been largely unrevealing to include recent normal CBC, CMP, TSH, ESR, CRP, TTG, IgA.  Past Medical History:  Diagnosis Date   Anxiety    Asthma    Depression    Heart murmur    IDA (iron deficiency anemia)    Seizure-like activity (HCC)    per neuro, pseudoseizures   Syncope    Walking pneumonia     No past surgical history on file.  Prior to Admission medications   Medication Sig Start Date End Date Taking? Authorizing Provider  FLUoxetine (PROZAC) 20 MG capsule Take 1 capsule (20 mg total) by mouth every morning. 03/28/21   Sater, Pearletha Furl, MD  gabapentin (NEURONTIN) 100 MG capsule Take 100 mg by mouth 3 (three) times daily.    [provider]  Multiple Vitamins-Minerals (MULTIVITAMIN WITH MINERALS) tablet Take 1 tablet by mouth daily. Patient not taking: Reported on 09/05/2022    [provider]  Na Sulfate-K Sulfate-Mg Sulf 17.5-3.13-1.6 GM/177ML SOLN Use as directed; may use generic; goodrx card if insurance will not cover generic 09/05/22   Zehr, Princella Pellegrini, PA-C  Phenazopyridine HCl (AZO TABS PO) Take by mouth.     [provider]  QUEtiapine (SEROQUEL) 50 MG tablet  10/16/21   [provider]    Current Outpatient Medications  Medication Sig Dispense Refill   FLUoxetine (PROZAC) 20 MG capsule Take 1 capsule (20 mg total) by mouth every morning. 90 capsule 3   gabapentin (NEURONTIN) 100 MG capsule Take 100 mg by mouth 3 (three) times daily.     Multiple Vitamins-Minerals (MULTIVITAMIN WITH MINERALS) tablet Take 1 tablet by mouth daily. (Patient not taking: Reported on 09/05/2022)     Na Sulfate-K Sulfate-Mg Sulf 17.5-3.13-1.6 GM/177ML SOLN Use as directed; may use generic; goodrx card if insurance will not cover generic 354 mL 0   Phenazopyridine HCl (AZO TABS PO) Take by mouth.     QUEtiapine (SEROQUEL) 50 MG tablet      No current facility-administered medications for this visit.    Allergies as of 10/08/2022 - Review Complete 09/05/2022  Allergen Reaction Noted   Amoxicillin Dermatitis, Rash, and Other (See Comments) 11/09/2013    Family History  Problem Relation Age of Onset   Heart failure Mother    Heart Problems Mother    Parkinson's disease Maternal Grandmother    Marfan syndrome Maternal Grandmother    Heart attack Maternal Grandfather    Transient ischemic attack Maternal Grandfather    Hypertrophic cardiomyopathy Maternal Uncle    Colon cancer Neg Hx    Stomach cancer Neg Hx    Esophageal cancer Neg Hx     Social  History   Socioeconomic History   Marital status: Single    Spouse name: Not on file   Number of children: 0   Years of education: Not on file   Highest education level: Not on file  Occupational History   Occupation: food Copy  Tobacco Use   Smoking status: Never   Smokeless tobacco: Never  Vaping Use   Vaping Use: Never used  Substance and Sexual Activity   Alcohol use: Not Currently   Drug use: Yes    Types: Marijuana   Sexual activity: Never    Birth control/protection: None  Other Topics Concern   Not on file  Social  History Narrative   Breslin is a rising 10th grade student at [employer]. She does well in school.   Lives with her mother. Plays basketball and runs on the track team.    Right handed   Caffeine: 1-2 soda a week   Social Determinants of Health   Financial Resource Strain: Not on file  Food Insecurity: Not on file  Transportation Needs: Not on file  Physical Activity: Not on file  Stress: Not on file  Social Connections: Not on file  Intimate Partner Violence: Not on file    Physical Exam: Vital signs in last 24 hours: @There  were no vitals taken for this visit. GEN: NAD EYE: Sclerae anicteric ENT: MMM CV: Non-tachycardic Pulm: CTA b/l GI: Soft, NT/ND NEURO:  Alert & Oriented x 3   Doristine Locks, DO [facility]   10/08/2022 3:25 PM

## 2022-10-08 NOTE — ED Notes (Signed)
Pt ambulatory to bathroom for urine sample.

## 2022-10-08 NOTE — Op Note (Signed)
[facility] Patient Name: Kristin Ingram Procedure Date: 10/08/2022 3:15 PM MRN: [id number] Endoscopist: Doristine Locks , MD, [id number] Age: 22 Referring MD:  Date of Birth: 2000-08-30 Gender: Female Account #: [id number] Procedure:                Colonoscopy Indications:              Generalized abdominal pain, Hematochezia, Change in                            bowel habits, Diarrhea                           Evaluation to date has been largely unrevealing to                            include recent normal CBC, CMP, TSH, ESR, CRP, TTG,                            IgA. Medicines:                Monitored Anesthesia Care Procedure:                Pre-Anesthesia Assessment:                           - Prior to the procedure, a History and Physical                            was performed, and patient medications and                            allergies were reviewed. The patient's tolerance of                            previous anesthesia was also reviewed. The risks                            and benefits of the procedure and the sedation                            options and risks were discussed with the patient.                            All questions were answered, and informed consent                            was obtained. Prior Anticoagulants: The patient has                            taken no anticoagulant or antiplatelet agents. ASA                            Grade Assessment: II - A patient with mild systemic  disease. After reviewing the risks and benefits,                            the patient was deemed in satisfactory condition to                            undergo the procedure.                           After obtaining informed consent, the colonoscope                            was passed under direct vision. Throughout the                            procedure, the patient's blood pressure, pulse, and                             oxygen saturations were monitored continuously. The                            Olympus SN [id number] was introduced through the anus                            and advanced to the the terminal ileum. The                            colonoscopy was performed without difficulty. The                            patient tolerated the procedure well. The quality                            of the bowel preparation was good. The terminal                            ileum, ileocecal valve, appendiceal orifice, and                            rectum were photographed. At the conclusion of the                            procedure, the colonoscope was reintroduced to the                            cecum and all of the air was withdrawn with                            colonoscope removal. Scope In: 3:45:40 PM Scope Out: 4:03:51 PM Scope Withdrawal Time: 0 hours 13 minutes 27 seconds  Total Procedure Duration: 0 hours 18 minutes 11 seconds  Findings:                 The perianal and digital rectal examinations were  normal.                           A 3 mm polyp was found in the recto-sigmoid colon.                            The polyp was sessile. The polyp was removed with a                            cold snare. Resection and retrieval were complete.                            Estimated blood loss was minimal.                           Normal mucosa was found in the entire colon. No                            areas of mucosal erythema, edema, erosions, or                            ulceration. Biopsies for histology were taken with                            a cold forceps from the right colon and left colon                            for evaluation of microscopic colitis. Estimated                            blood loss was minimal.                           Non-bleeding internal hemorrhoids were found during                            retroflexion. The hemorrhoids  were small and Grade                            I (internal hemorrhoids that do not prolapse).                           The terminal ileum appeared normal. Complications:            No immediate complications. Estimated Blood Loss:     Estimated blood loss was minimal. Impression:               - One 3 mm polyp at the recto-sigmoid colon,                            removed with a cold snare. Resected and retrieved.                           - Normal mucosa in the entire examined colon.  Biopsied.                           - Non-bleeding internal hemorrhoids.                           - The examined portion of the ileum was normal.                           - The GI Genius (intelligent endoscopy module),                            computer-aided polyp detection system powered by AI                            was utilized to detect colorectal polyps through                            enhanced visualization during colonoscopy. Recommendation:           - Patient has a contact number available for                            emergencies. The signs and symptoms of potential                            delayed complications were discussed with the                            patient. Return to normal activities tomorrow.                            Written discharge instructions were provided to the                            patient.                           - Resume previous diet.                           - Continue present medications.                           - Await pathology results.                           - Repeat colonoscopy for surveillance based on                            pathology results. If the polyp is hyperplastic,                            plan for repeat colonsocopy at age 85 for routine                            Colon  Cancer screening.                           - Use fiber, for example Citrucel, Fibercon, Konsyl                            or  Metamucil.                           - Follow-up with Doug Sou, PA-C in the GI                            clinic PRN.                           - If hemorrhoidal symptoms persist, can consider                            hemorrhoid band ligation in the office. Doristine Locks, MD 10/08/2022 4:14:32 PM

## 2022-10-08 NOTE — ED Triage Notes (Signed)
BIB EMS from GI office, pt was having a colonoscopy and had a polyp removed. After waking up from sedation, staff witnessed pseudoseizure activity.  Hx of pseudoseizures.  Pt A&Ox4 on arrival.

## 2022-10-08 NOTE — Progress Notes (Signed)
Called to room to assist during endoscopic procedure.  Patient ID and intended procedure confirmed with present staff. Received instructions for my participation in the procedure from the performing physician.  

## 2022-10-08 NOTE — ED Provider Notes (Signed)
[place] EMERGENCY DEPARTMENT AT [facility] Provider Note   CSN: [id number] Arrival date & time: 10/08/22  1726     History  Chief Complaint  Patient presents with   Seizures    Kristin Ingram is a 22 y.o. female with a history of pseudoseizures presenting to the ED by EMS with concern for seizure-like activity.  Per EMS report, as well as my review of external records, the patient had a colonoscopy performed today.  During the postop period after the colonoscopy she was noted to have 2 episodes where she had generalized clenching of her arms and legs.  Decision was made to send her to the ER for evaluation.  The patient is now awake, says that she is feeling back to normal but is extremely fatigued which is common after her episodes.  Per my review of external records, the patient was seen by [facility] neurology in 2022, Dr Despina Arias in Nov 2020, who notes that the patient had a EEG performed, and during this testing she had 2 episodes of her typical "spells" which did not correlate with epileptiform activity.  He did feel that these episodes were therefore consistent with pseudoseizures.    HPI     Home Medications Prior to Admission medications   Medication Sig Start Date End Date Taking? Authorizing Provider  FLUoxetine (PROZAC) 20 MG capsule Take 1 capsule (20 mg total) by mouth every morning. Patient not taking: Reported on 10/08/2022 03/28/21   Asa Lente, MD  gabapentin (NEURONTIN) 100 MG capsule Take 100 mg by mouth 3 (three) times daily.    [provider]  Multiple Vitamins-Minerals (MULTIVITAMIN WITH MINERALS) tablet Take 1 tablet by mouth daily.    [provider]  Phenazopyridine HCl (AZO TABS PO) Take by mouth. Patient not taking: Reported on 10/08/2022    [provider]  QUEtiapine (SEROQUEL) 50 MG tablet  10/16/21   [provider]      Allergies    Amoxicillin    Review of Systems   Review of  Systems  Physical Exam Updated Vital Signs BP 127/84   Pulse 66   Temp (!) 97.5 F (36.4 C) (Oral)   Resp 20   Ht 5\' 4"  (1.626 m)   Wt 63.5 kg   SpO2 100%   BMI 24.03 kg/m  Physical Exam Constitutional:      General: She is not in acute distress. HENT:     Head: Normocephalic and atraumatic.  Eyes:     Conjunctiva/sclera: Conjunctivae normal.     Pupils: Pupils are equal, round, and reactive to light.  Cardiovascular:     Rate and Rhythm: Normal rate and regular rhythm.  Pulmonary:     Effort: Pulmonary effort is normal. No respiratory distress.  Abdominal:     General: There is no distension.     Tenderness: There is no abdominal tenderness.  Skin:    General: Skin is warm and dry.  Neurological:     General: No focal deficit present.     Mental Status: She is alert and oriented to person, place, and time. Mental status is at baseline.     Cranial Nerves: No cranial nerve deficit.     Sensory: No sensory deficit.     Motor: No weakness.     ED Results / Procedures / Treatments   Labs (all labs ordered are listed, but only abnormal results are displayed) Labs Reviewed  CBC - Abnormal; Notable for the following components:  Result Value   RBC 3.66 (*)    Hemoglobin 10.4 (*)    HCT 32.8 (*)    All other components within normal limits  BASIC METABOLIC PANEL - Abnormal; Notable for the following components:   Calcium 8.6 (*)    All other components within normal limits  I-STAT BETA HCG BLOOD, ED ([facility], [facility], [facility] ONLY)    EKG None  Radiology No results found.  Procedures Procedures    Medications Ordered in ED Medications - No data to display  ED Course/ Medical Decision Making/ A&P Clinical Course as of 10/08/22 2137  Wed Oct 08, 2022  1837 Patient mother is now present at the bedside.  We had a discussion including the patient, appears to be completely back to baseline mental status at this time.  Mother reports concerns the patient is continue to  have these pseudoseizures every 3 to 4 months.  She reports there is a deliberate correlation between the patient's stress and pain levels and having these episodes.   I do think that is consistent with pseudoseizures, and again reassured the patient and her mother that pseudoseizures are not intentional acts - the recommended treatment is typically therapy and psychiatric treatment, which the patient already engages in. [MT]  2000 Patient reassessed again and remains at baseline.  We discussed with the patient and her mother her medical workup including her anemia, which appears to be chronic and she is aware of.  At this time she is stable for discharge. [MT]    Clinical Course User Index [MT] Vallarie Fei, Kermit Balo, MD                             Medical Decision Making Amount and/or Complexity of Data Reviewed Labs: ordered.   This patient presents to the ED with concern for seizure-like activity. This involves an extensive number of treatment options, and is a complaint that carries with it a high risk of complications and morbidity.  The differential diagnosis includes pseudoseizures versus postanesthesia reaction vs other  Additional history obtained from EMS  External records from outside source obtained and reviewed including EEG report and neurology office evaluation from 2022  I ordered and personally interpreted labs.  The pertinent results include: Baseline chronic anemia, no other emergent findings  The patient was maintained on a cardiac monitor.  I personally viewed and interpreted the cardiac monitored which showed an underlying rhythm of: NSR  Test Considered: Very low suspicion for CVA, arrhythmia, PE - no indication for neuroimaging  After the interventions noted above, I reevaluated the patient and found that they have: improved   Dispostion:  After consideration of the diagnostic results and the patients response to treatment, I feel that the patent would benefit from  outpatient follow up.         Final Clinical Impression(s) / ED Diagnoses Final diagnoses:  Seizure-like activity (HCC)  Anemia, unspecified type    Rx / DC Orders ED Discharge Orders     None         Terald Sleeper, MD 10/08/22 2137

## 2022-10-08 NOTE — Progress Notes (Signed)
Pt's states no medical or surgical changes since previsit or office visit. VS assessed by D.T 

## 2022-10-08 NOTE — Progress Notes (Unsigned)
Uneventful anesthetic. Report to pacu rn. Vss. Care resumed by rn. 

## 2022-10-08 NOTE — Progress Notes (Signed)
Patient received to PACU, VSS, patient asleep, no complaint of pain, mother at bedside. Prior to discharge, patient began demonstrating contractures and muscle spasms of hands and arms, vital signs stable patient became unresponsive. Anesthesia and Dr. Barron Alvine notified and at bedside. Patient became responsive, but continuing to have contractures and spasms of hands. Decision made by Dr. Barron Alvine to transfer patient to hospital. Patient with stable vital signs, mother accompanying patient to ED. B.Adarryl Goldammer RN.

## 2022-10-09 ENCOUNTER — Telehealth: Payer: Self-pay

## 2022-10-09 NOTE — Telephone Encounter (Signed)
Attempted f/u call. No answer 

## 2022-10-13 ENCOUNTER — Encounter: Payer: Self-pay | Admitting: Gastroenterology

## 2024-05-08 ENCOUNTER — Ambulatory Visit (HOSPITAL_COMMUNITY): Admission: EM | Admit: 2024-05-08 | Discharge: 2024-05-09 | Disposition: A | Attending: Urology | Admitting: Urology

## 2024-05-08 DIAGNOSIS — R9431 Abnormal electrocardiogram [ECG] [EKG]: Secondary | ICD-10-CM | POA: Insufficient documentation

## 2024-05-08 DIAGNOSIS — R45851 Suicidal ideations: Secondary | ICD-10-CM | POA: Insufficient documentation

## 2024-05-08 DIAGNOSIS — F314 Bipolar disorder, current episode depressed, severe, without psychotic features: Secondary | ICD-10-CM | POA: Diagnosis not present

## 2024-05-08 DIAGNOSIS — F332 Major depressive disorder, recurrent severe without psychotic features: Secondary | ICD-10-CM

## 2024-05-08 DIAGNOSIS — Z630 Problems in relationship with spouse or partner: Secondary | ICD-10-CM | POA: Diagnosis not present

## 2024-05-08 DIAGNOSIS — Z604 Social exclusion and rejection: Secondary | ICD-10-CM | POA: Insufficient documentation

## 2024-05-08 LAB — CBC WITH DIFFERENTIAL/PLATELET
Abs Immature Granulocytes: 0.01 K/uL (ref 0.00–0.07)
Basophils Absolute: 0 K/uL (ref 0.0–0.1)
Basophils Relative: 0 %
Eosinophils Absolute: 0 K/uL (ref 0.0–0.5)
Eosinophils Relative: 0 %
HCT: 36.7 % (ref 36.0–46.0)
Hemoglobin: 11.7 g/dL — ABNORMAL LOW (ref 12.0–15.0)
Immature Granulocytes: 0 %
Lymphocytes Relative: 28 %
Lymphs Abs: 1.9 K/uL (ref 0.7–4.0)
MCH: 26.5 pg (ref 26.0–34.0)
MCHC: 31.9 g/dL (ref 30.0–36.0)
MCV: 83.2 fL (ref 80.0–100.0)
Monocytes Absolute: 0.5 K/uL (ref 0.1–1.0)
Monocytes Relative: 7 %
Neutro Abs: 4.4 K/uL (ref 1.7–7.7)
Neutrophils Relative %: 65 %
Platelets: 276 K/uL (ref 150–400)
RBC: 4.41 MIL/uL (ref 3.87–5.11)
RDW: 15.8 % — ABNORMAL HIGH (ref 11.5–15.5)
WBC: 6.8 K/uL (ref 4.0–10.5)
nRBC: 0 % (ref 0.0–0.2)

## 2024-05-08 LAB — LIPID PANEL
Cholesterol: 206 mg/dL — ABNORMAL HIGH (ref 0–200)
HDL: 66 mg/dL (ref >40)
LDL Cholesterol: 127 mg/dL — ABNORMAL HIGH (ref 0–99)
Total CHOL/HDL Ratio: 3.1 ratio
Triglycerides: 63 mg/dL (ref <150)
VLDL: 13 mg/dL (ref 0–40)

## 2024-05-08 LAB — COMPREHENSIVE METABOLIC PANEL WITH GFR
ALT: 16 U/L (ref 0–44)
AST: 23 U/L (ref 15–41)
Albumin: 4.3 g/dL (ref 3.5–5.0)
Alkaline Phosphatase: 45 U/L (ref 38–126)
Anion gap: 12 (ref 5–15)
BUN: 11 mg/dL (ref 6–20)
CO2: 25 mmol/L (ref 22–32)
Calcium: 9.3 mg/dL (ref 8.9–10.3)
Chloride: 101 mmol/L (ref 98–111)
Creatinine, Ser: 0.73 mg/dL (ref 0.44–1.00)
GFR, Estimated: >60 mL/min (ref >60)
Glucose, Bld: 88 mg/dL (ref 70–99)
Potassium: 3.9 mmol/L (ref 3.5–5.1)
Sodium: 138 mmol/L (ref 135–145)
Total Bilirubin: 1.3 mg/dL — ABNORMAL HIGH (ref 0.0–1.2)
Total Protein: 8.1 g/dL (ref 6.5–8.1)

## 2024-05-08 LAB — ETHANOL: Alcohol, Ethyl (B): <15 mg/dL (ref <15)

## 2024-05-08 LAB — TSH: TSH: 0.811 u[IU]/mL (ref 0.350–4.500)

## 2024-05-08 LAB — HEMOGLOBIN A1C
Hgb A1c MFr Bld: 5.1 % (ref 4.8–5.6)
Mean Plasma Glucose: 99.67 mg/dL

## 2024-05-08 MED ORDER — HALOPERIDOL LACTATE 5 MG/ML IJ SOLN: 5.0000 mg | Freq: Three times a day (TID) | INTRAMUSCULAR | Status: DC | PRN

## 2024-05-08 MED ORDER — HYDROXYZINE HCL 25 MG PO TABS
25.0000 mg | ORAL_TABLET | Freq: Three times a day (TID) | ORAL | Status: DC | PRN
  Administered 2024-05-08: 25 mg via ORAL
  Filled 2024-05-08: qty 1

## 2024-05-08 MED ORDER — LORAZEPAM 2 MG/ML IJ SOLN: 2.0000 mg | Freq: Three times a day (TID) | INTRAMUSCULAR | Status: DC | PRN

## 2024-05-08 MED ORDER — DIPHENHYDRAMINE HCL 50 MG/ML IJ SOLN: 50.0000 mg | Freq: Three times a day (TID) | INTRAMUSCULAR | Status: DC | PRN

## 2024-05-08 MED ORDER — MAGNESIUM HYDROXIDE 400 MG/5ML PO SUSP: 30.0000 mL | Freq: Every day | ORAL | Status: DC | PRN

## 2024-05-08 MED ORDER — HALOPERIDOL LACTATE 5 MG/ML IJ SOLN: 10.0000 mg | Freq: Three times a day (TID) | INTRAMUSCULAR | Status: DC | PRN

## 2024-05-08 MED ORDER — DIPHENHYDRAMINE HCL 50 MG PO CAPS: 50.0000 mg | ORAL_CAPSULE | Freq: Three times a day (TID) | ORAL | Status: DC | PRN

## 2024-05-08 MED ORDER — ALUM & MAG HYDROXIDE-SIMETH 200-200-20 MG/5ML PO SUSP: 30.0000 mL | ORAL | Status: DC | PRN

## 2024-05-08 MED ORDER — TRAZODONE HCL 50 MG PO TABS
50.0000 mg | ORAL_TABLET | Freq: Every evening | ORAL | Status: DC | PRN
  Administered 2024-05-08: 50 mg via ORAL
  Filled 2024-05-08: qty 1

## 2024-05-08 MED ORDER — ACETAMINOPHEN 325 MG PO TABS: 650.0000 mg | ORAL_TABLET | Freq: Four times a day (QID) | ORAL | Status: DC | PRN

## 2024-05-08 MED ORDER — HALOPERIDOL 5 MG PO TABS: 5.0000 mg | ORAL_TABLET | Freq: Three times a day (TID) | ORAL | Status: DC | PRN

## 2024-05-08 NOTE — BH Assessment (Signed)
 Comprehensive Clinical Assessment (CCA) Note   05/08/2024 Kristin Ingram [id number]  Disposition: Per Kathryne Show, NP patient is recommended for inpatient admission.  Disposition SW to pursue appropriate inpatient options.  The patient demonstrates the following risk factors for suicide: Chronic risk factors for suicide include: psychiatric disorder of Bipolar. Acute risk factors for suicide include: social withdrawal/isolation. Protective factors for this patient include: positive social support. Considering these factors, the overall suicide risk at this point appears to be low. Patient is not appropriate for outpatient follow up.   Patient is a 23 year old female with a history of bipolar disorder who presents voluntarily to [facility] for an assessment. Patient resides alone but identifies her mother and Godparents as their primary support system.Patient reports isolation, crying spells, irritability, hopelessness, guilt, loss of interest to do things they enjoy, fatigue, lack of concentration, worthlessness, change in sleep, change in appetite. Pt reports hx of having suicidal thoughts but denies any attempts. Pt denies any drug/ etoh use. Patient denies NSSIB, SI, HI, AVH.  Patient identifies her primary stressors as mental health.Patient denies history of abuse or trauma. Patient denies current legal problems. Patient is not receiving outpatient therapy and psychiatry services. Patient reports she is currently not taking any  psychotropic medications. Patient reports previous inpatient admission in 2023 when she was feeling suicidal at that time as well.  Patient denies access to weapons.  During evaluation pt is in no acute distress. She is alert, oriented x 4, calm, cooperative and attentive. her mood is closed / guarded, depressed, and flat with congruent affect. She has normal speech, and behavior.  Objectively there is no evidence of psychosis/mania or  delusional thinking.  Patient is able to converse coherently, goal directed thoughts, no distractibility, or pre-occupation.   She also denies suicidal/self-harm/homicidal ideation, psychosis, and paranoia.  Patient answered question appropriately.       Chief Complaint:SI  Visit Diagnosis: Bipolar Disorder    CCA Screening, Triage and Referral (STR)  Patient Reported Information How did you hear about us ? Legal System  What Is the Reason for Your Visit/Call Today? Pt was brought to [facility] voluntarily by [employer]. Pt reports that she contacted the police and stated that she wanted to kill herself. Pt reports that she started feeling suicidal today. Pt denies a plan. Pt reports a MH diagnosis of Bipolar. Pt denies outpatient services and medication management. Pt denies HI, AVH, drug/etoh use.  How Long Has This Been Causing You Problems? <Week  What Do You Feel Would Help You the Most Today? Treatment for Depression or other mood problem; Stress Management; Medication(s)   Have You Recently Had Any Thoughts About Hurting Yourself? Yes  Are You Planning to Commit Suicide/Harm Yourself At This time? No   Flowsheet Row ED from 10/08/2022 in [facility] Emergency Department at [facility] from 10/27/2021 in [facility] at [facility] ED from 06/22/2021 in [facility] Emergency Department at [facility]  C-SSRS RISK CATEGORY No Risk No Risk No Risk    Have you Recently Had Thoughts About Hurting Someone [name]? No  Are You Planning to Harm Someone at This Time? No  Explanation: Denies HI   Have You Used Any Alcohol or Drugs in the Past 24 Hours? No  How Long Ago Did You Use Drugs or Alcohol? N/a What Did You Use and How Much? N/a  Do You Currently Have a Therapist/Psychiatrist? No  Name of Therapist/Psychiatrist:    Have You  Been Recently Discharged From Any Office Practice or Programs? No  Explanation of Discharge From Practice/Program: n/a    CCA  Screening Triage Referral Assessment Type of Contact: Face-to-Face  Telemedicine Service Delivery:   Is this Initial or Reassessment?   Date Telepsych consult ordered in [facility]:    Time Telepsych consult ordered in [facility]:    Location of Assessment: [facility] Assessment Services  Provider Location: [facility] Assessment Services   Collateral Involvement: n/a   Does Patient Have a Automotive Engineer Guardian? No  Legal Guardian Contact Information: n/a  Copy of Legal Guardianship Form: -- (n/a)  Legal Guardian Notified of Arrival: -- (n/a)  Legal Guardian Notified of Pending Discharge: -- (n/a)  If Minor and Not Living with Parent(s), Who has Custody? n/a  Is CPS involved or ever been involved? Never  Is APS involved or ever been involved? Never   Patient Determined To Be At Risk for Harm To Self or Others Based on Review of Patient Reported Information or Presenting Complaint? Yes, for Self-Harm  Method: No Plan  Availability of Means: No access or NA  Intent: Vague intent or NA  Notification Required: No need or identified person  Additional Information for Danger to Others Potential: -- (n/a)  Additional Comments for Danger to Others Potential: n/a  Are There Guns or Other Weapons in Your Home? No  Types of Guns/Weapons: n/a  Are These Weapons Safely Secured?                            No  Who Could Verify You Are Able To Have These Secured: Denies access  Do You Have any Outstanding Charges, Pending Court Dates, Parole/Probation? Denies pending legal charges  Contacted To Inform of Risk of Harm To Self or Others: -- (n/a)    Does Patient Present under Involuntary Commitment? No    [place] of Residence: [place]   Patient Currently Receiving the Following Services: Not Receiving Services   Determination of Need: Urgent (48 hours)   Options For Referral: Inpatient Hospitalization; Outpatient Therapy; Medication Management     CCA  Biopsychosocial Patient Reported Schizophrenia/Schizoaffective Diagnosis in Past: No   Strengths: Have support system & some insight.   Mental Health Symptoms Depression:  Difficulty Concentrating; Hopelessness; Sleep (too much or little)   Duration of Depressive symptoms: Duration of Depressive Symptoms: Greater than two weeks   Mania:  None   Anxiety:   None   Psychosis:  None   Duration of Psychotic symptoms: Duration of Psychotic Symptoms: N/A   Trauma:  N/A   Obsessions:  None   Compulsions:  None   Inattention:  N/A   Hyperactivity/Impulsivity:  N/A   Oppositional/Defiant Behaviors:  N/A   Emotional Irregularity:  N/A   Other Mood/Personality Symptoms:  none    Mental Status Exam Appearance and self-care  Stature:  Average   Weight:  Average weight   Clothing:  Neat/clean; Age-appropriate   Grooming:  Normal   Cosmetic use:  Age appropriate   Posture/gait:  Normal   Motor activity:  -- (Within normal range)   Sensorium  Attention:  Normal   Concentration:  Normal   Orientation:  X5   Recall/memory:  Normal   Affect and Mood  Affect:  Full Range; Appropriate   Mood:  Depressed   Relating  Eye contact:  Normal   Facial expression:  Depressed   Attitude toward examiner:  Cooperative   Thought and  Language  Speech flow: Clear and Coherent; Normal   Thought content:  Appropriate to Mood and Circumstances   Preoccupation:  None   Hallucinations:  Auditory; Visual (Per the mother)   Organization:  -- (Per the mother)   Company Secretary of Knowledge:  Fair (Per the mother)   Intelligence:  Average (Per the mother)   Abstraction:  Abstract (Per the mother)   Judgement:  Impaired (Per the mother)   Reality Testing:  Distorted (Per the mother)   Insight:  Poor; Fair (Per the mother)   Decision Making:  Impulsive (Per the mother)   Social Functioning  Social Maturity:  Impulsive (Per the mother)   Social  Judgement:  Impropriety (Per the mother)   Stress  Stressors:  Other (Comment)   Coping Ability:  Deficient supports (Per the mother)   Skill Deficits:  -- (Per the mother)   Supports:  Family; Friends/Service system (Per the mother)     Religion: Religion/Spirituality Are You A Religious Person?: No How Might This Affect Treatment?: n/a  Leisure/Recreation: Leisure / Recreation Do You Have Hobbies?: No  Exercise/Diet: Exercise/Diet Do You Exercise?: No Have You Gained or Lost A Significant Amount of Weight in the Past Six Months?: No Do You Follow a Special Diet?: No Do You Have Any Trouble Sleeping?: No   CCA Employment/Education Employment/Work Situation: Employment / Work Situation Employment Situation: Employed Work Stressors: Did not report any stressors Patient's Job has Been Impacted by Current Illness: No Has Patient ever Been in Equities Trader?: No  Education: Education Is Patient Currently Attending School?: No Last Grade Completed: 12 Did You Product Manager?: No Did You Have An Individualized Education Program (IIEP): No Did You Have Any Difficulty At [employer]?: No Patient's Education Has Been Impacted by Current Illness: No   CCA Family/Childhood History Family and Relationship History: Family history Marital status: Single Does patient have children?: No  Childhood History:  Childhood History By whom was/is the patient raised?: Both parents Did patient suffer any verbal/emotional/physical/sexual abuse as a child?: No Did patient suffer from severe childhood neglect?: No Has patient ever been sexually abused/assaulted/raped as an adolescent or adult?: No Was the patient ever a victim of a crime or a disaster?: No Witnessed domestic violence?: No Has patient been affected by domestic violence as an adult?: No       CCA Substance Use Alcohol/Drug Use: Alcohol / Drug Use Pain Medications: See MAR Prescriptions: See MAR Over the Counter:  See MAR History of alcohol / drug use?: No history of alcohol / drug abuse Longest period of sobriety (when/how long): Pt denies any current etoh/ drug use Negative Consequences of Use:  (n/a) Withdrawal Symptoms:  (n/a)                         ASAM's:  Six Dimensions of Multidimensional Assessment  Dimension 1:  Acute Intoxication and/or Withdrawal Potential:      Dimension 2:  Biomedical Conditions and Complications:      Dimension 3:  Emotional, Behavioral, or Cognitive Conditions and Complications:     Dimension 4:  Readiness to Change:     Dimension 5:  Relapse, Continued use, or Continued Problem Potential:     Dimension 6:  Recovery/Living Environment:     ASAM Severity Score:    ASAM Recommended Level of Treatment: ASAM Recommended Level of Treatment:  (n/a)   Substance use Disorder (SUD) Substance Use Disorder (SUD)  Checklist Symptoms of  Substance Use:  (n/a)  Recommendations for Services/Supports/Treatments: Recommendations for Services/Supports/Treatments Recommendations For Services/Supports/Treatments: Inpatient Hospitalization  Disposition Recommendation per psychiatric provider: We recommend inpatient psychiatric hospitalization when medically cleared. Patient is under voluntary admission status at this time; please IVC if attempts to leave hospital.   DSM5 Diagnoses: Patient Active Problem List   Diagnosis Date Noted   Abdominal pain 09/05/2022   Diarrhea 09/05/2022   Rectal bleeding 09/05/2022   Mood disturbance 06/24/2021   Adjustment disorder with mixed anxiety and depressed mood    Migraine without aura and without status migrainosus, not intractable 12/26/2015   Tension headache 12/26/2015   Vasovagal syncope 12/26/2015     Referrals to Alternative Service(s): Referred to Alternative Service(s):   Place:   Date:   Time:    Referred to Alternative Service(s):   Place:   Date:   Time:    Referred to Alternative Service(s):   Place:    Date:   Time:    Referred to Alternative Service(s):   Place:   Date:   Time:     Rosina PARAS, MA,LCMHC

## 2024-05-08 NOTE — ED Provider Notes (Signed)
 [facility] Continuous Assessment Admission H&P  Date: 05/08/2024 Patient Name: Kristin Ingram MRN: [id number] Chief Complaint: depression  Diagnoses:  Final diagnoses:  None    HPI: Kristin Ingram is a 23 year old female with a history of Bipolar Disorder who presents voluntarily to [facility] with worsening depressive symptoms and suicidal ideation. patient reports she has been depressed all the time, with a significant intensification of symptoms today. She denies a specific acute trigger but reports a recent breakup with her girlfriend.  Patient endorses depressive symptoms including social isolation, poor sleep, frequent crying spells, feelings of hopelessness, and thoughts of suicide. Earlier today, patient states she felt unsafe and contacted law enforcement due to escalating emotional distress. patient's mother reports that patient had a loaded gun and was going to kill herself but contacted police for help. The firearm has since been secured and is currently in patient's mother's possession. patient denies any prior history of suicidal ideation or suicide attempts but reports 2 prior psychiatric hospitalizations related to mood instability.  Patient denies homicidal ideation, auditory or visual hallucinations, paranoia, or manic symptoms at this time. She denies alcohol or illicit substance use.    Patient is alert and oriented x4. She is cooperative but tearful at times. Eye contact is good. Speech is soft, slowed, and coherent. Mood is described as depressed, with affect congruent, constricted, and tearful. Thought process is linear and goal-directed. Thought content is notable for suicidal ideation. She denies homicidal ideation, delusions, paranoia, or perceptual disturbances. No auditory or visual hallucinations are observed or reported. Insight is limited, and judgment is impaired.   Plan: Given the patient's worsening depressive symptoms, active suicidal  ideation with possession of a loaded firearm earlier today, inpatient psychiatric admission is recommended for safety and stabilization.  Discussed initiation of psychotropic medication for mood stabilization; however, the patient was not receptive and stated she does not wish to start any medications at this time.  Total Time spent with patient: 30 minutes  Musculoskeletal  Strength & Muscle Tone: within normal limits Gait & Station: normal Patient leans: Right  Psychiatric Specialty Exam  Presentation General Appearance:  Appropriate for Environment  Eye Contact: Good  Speech: Clear and Coherent  Speech Volume: Normal  Handedness: Right   Mood and Affect  Mood: Depressed  Affect: Congruent   Thought Process  Thought Processes: Coherent  Descriptions of Associations:Intact  Orientation:Full (Time, Place and Person)  Thought Content:WDL  Diagnosis of Schizophrenia or Schizoaffective disorder in past: No   Hallucinations:Hallucinations: None  Ideas of Reference:None  Suicidal Thoughts:Suicidal Thoughts: No  Homicidal Thoughts:Homicidal Thoughts: No   Sensorium  Memory: Immediate Good; Recent Good; Remote Good  Judgment: Poor  Insight: Fair   Art Therapist  Concentration: Fair  Attention Span: Good  Recall: Good  Fund of Knowledge: Good  Language: Good   Psychomotor Activity  Psychomotor Activity: Psychomotor Activity: Normal   Assets  Assets: Communication Skills; Desire for Improvement; Housing; Physical Health; Social Support   Sleep  Sleep: Sleep: Fair Number of Hours of Sleep: 6   Nutritional Assessment (For OBS and [facility]BC admissions only) Has the patient had a weight loss or gain of 10 pounds or more in the last 3 months?: No Has the patient had a decrease in food intake/or appetite?: Yes Does the patient have dental problems?: No Does the patient have eating habits or behaviors that may be indicators of  an eating disorder including binging or inducing vomiting?: No Has the patient recently lost weight without  trying?: 0 Has the patient been eating poorly because of a decreased appetite?: 0 Malnutrition Screening Tool Score: 0    Physical Exam Vitals and nursing note reviewed.  Constitutional:      General: She is not in acute distress.    Appearance: She is well-developed.  HENT:     Head: Normocephalic and atraumatic.  Eyes:     Conjunctiva/sclera: Conjunctivae normal.  Cardiovascular:     Rate and Rhythm: Normal rate.  Pulmonary:     Effort: Pulmonary effort is normal.  Musculoskeletal:        General: No swelling.     Cervical back: Normal range of motion.  Skin:    General: Skin is warm.  Neurological:     Mental Status: She is alert.  Psychiatric:        Attention and Perception: Attention and perception normal.        Mood and Affect: Mood is anxious. Affect is tearful.        Speech: Speech normal.        Behavior: Behavior normal. Behavior is cooperative.        Thought Content: Thought content includes suicidal ideation. Thought content includes suicidal plan.    Review of Systems  Constitutional: Negative.   HENT: Negative.    Eyes: Negative.   Respiratory: Negative.    Cardiovascular: Negative.   Gastrointestinal: Negative.   Genitourinary: Negative.   Musculoskeletal: Negative.   Skin: Negative.   Neurological: Negative.   Endo/Heme/Allergies: Negative.   Psychiatric/Behavioral:  Positive for depression and suicidal ideas. The patient is nervous/anxious.     There were no vitals taken for this visit. There is no height or weight on file to calculate BMI.  Past Psychiatric History: Bipolar disoder, anxiety, depression   Is the patient at risk to self? Yes  Has the patient been a risk to self in the past 6 months? No .    Has the patient been a risk to self within the distant past? No   Is the patient a risk to others? No   Has the patient been a  risk to others in the past 6 months? No   Has the patient been a risk to others within the distant past? No   Past Medical History:  Past Medical History:  Diagnosis Date   Anxiety    Asthma    Depression    Heart murmur    IDA (iron deficiency anemia)    Seizure-like activity (HCC)    per neuro, pseudoseizures   Syncope    Walking pneumonia      Family History:  Family History  Problem Relation Age of Onset   Heart failure Mother    Heart Problems Mother    Parkinson's disease Maternal Grandmother    Marfan syndrome Maternal Grandmother    Heart attack Maternal Grandfather    Transient ischemic attack Maternal Grandfather    Hypertrophic cardiomyopathy Maternal Uncle    Colon cancer Neg Hx    Stomach cancer Neg Hx    Esophageal cancer Neg Hx      Social History: None reported   Last Labs:  No visits with results within 6 Month(s) from this visit.  Latest known visit with results is:  Admission on 10/08/2022, Discharged on 10/08/2022  Component Date Value Ref Range Status   WBC 10/08/2022 4.3  4.0 - 10.5 K/uL Final   RBC 10/08/2022 3.66 (L)  3.87 - 5.11 MIL/uL Final   Hemoglobin 10/08/2022 10.4 (L)  12.0 - 15.0 g/dL Final   HCT 94/84/7975 32.8 (L)  36.0 - 46.0 % Final   MCV 10/08/2022 89.6  80.0 - 100.0 fL Final   MCH 10/08/2022 28.4  26.0 - 34.0 pg Final   MCHC 10/08/2022 31.7  30.0 - 36.0 g/dL Final   RDW 94/84/7975 14.6  11.5 - 15.5 % Final   Platelets 10/08/2022 173  150 - 400 K/uL Final   nRBC 10/08/2022 0.0  0.0 - 0.2 % Final   Performed at [facility], [address], [place], [place] [address]   I-stat hCG, quantitative 10/08/2022 <5.0  <5 mIU/mL Final   Comment 3 10/08/2022          Final   Comment:   GEST. AGE      CONC.  (mIU/mL)   <=1 WEEK        5 - 50     2 WEEKS       50 - 500     3 WEEKS       100 - 10,000     4 WEEKS     1,000 - 30,000        FEMALE AND NON-PREGNANT FEMALE:     LESS THAN 5 mIU/mL    Sodium 10/08/2022  139  135 - 145 mmol/L Final   Potassium 10/08/2022 4.1  3.5 - 5.1 mmol/L Final   Chloride 10/08/2022 108  98 - 111 mmol/L Final   CO2 10/08/2022 25  22 - 32 mmol/L Final   Glucose, Bld 10/08/2022 97  70 - 99 mg/dL Final   Glucose reference range applies only to samples taken after fasting for at least 8 hours.   BUN 10/08/2022 11  6 - 20 mg/dL Final   Creatinine, Ser 10/08/2022 0.74  0.44 - 1.00 mg/dL Final   Calcium 94/84/7975 8.6 (L)  8.9 - 10.3 mg/dL Final   GFR, Estimated 10/08/2022 >60  >60 mL/min Final   Comment: (NOTE) Calculated using the CKD-EPI Creatinine Equation (2021)    Anion gap 10/08/2022 6  5 - 15 Final   Performed at [facility], [address], [place], [place] [address]    Allergies: Amoxicillin  Medications:  Facility Ordered Medications  Medication   acetaminophen  (TYLENOL ) tablet 650 mg   alum & mag hydroxide-simeth (MAALOX/MYLANTA) 200-200-20 MG/5ML suspension 30 mL   magnesium  hydroxide (MILK OF MAGNESIA) suspension 30 mL   haloperidol  (HALDOL ) tablet 5 mg   And   diphenhydrAMINE  (BENADRYL ) capsule 50 mg   haloperidol  lactate (HALDOL ) injection 5 mg   And   diphenhydrAMINE  (BENADRYL ) injection 50 mg   And   LORazepam  (ATIVAN ) injection 2 mg   haloperidol  lactate (HALDOL ) injection 10 mg   And   diphenhydrAMINE  (BENADRYL ) injection 50 mg   And   LORazepam  (ATIVAN ) injection 2 mg   hydrOXYzine  (ATARAX ) tablet 25 mg   traZODone  (DESYREL ) tablet 50 mg   PTA Medications  Medication Sig   Multiple Vitamins-Minerals (MULTIVITAMIN WITH MINERALS) tablet Take 1 tablet by mouth daily.   FLUoxetine  (PROZAC ) 20 MG capsule Take 1 capsule (20 mg total) by mouth every morning. (Patient not taking: Reported on 10/08/2022)   QUEtiapine (SEROQUEL) 50 MG tablet    Phenazopyridine HCl (AZO TABS PO) Take by mouth. (Patient not taking: Reported on 10/08/2022)   gabapentin (NEURONTIN) 100 MG capsule Take 100 mg by mouth 3 (three) times daily.       Medical Decision Making   Plan: Given the  patient's worsening depressive symptoms, active suicidal ideation with possession of a loaded firearm earlier today, inpatient psychiatric admission is recommended for safety and stabilization.  Discussed initiation of psychotropic medication for mood stabilization; however, the patient was not receptive and stated she does not wish to start any medications at this time.   Lab Orders         CBC with Differential/Platelet         Comprehensive metabolic panel         Hemoglobin A1c         Ethanol         Lipid panel         TSH         Pregnancy, urine         POCT Urine Drug Screen - (I-Screen)     Recommendations  Based on my evaluation the patient does not appear to have an emergency medical condition.  Kathryne DELENA Show, NP 05/08/2024  10:21 PM

## 2024-05-09 ENCOUNTER — Other Ambulatory Visit: Payer: Self-pay

## 2024-05-09 MED ORDER — HYDROXYZINE HCL 25 MG PO TABS: 25.0000 mg | ORAL_TABLET | Freq: Three times a day (TID) | ORAL | 0 refills | Status: AC | PRN

## 2024-05-09 MED ORDER — TRAZODONE HCL 50 MG PO TABS: 50.0000 mg | ORAL_TABLET | Freq: Every evening | ORAL | 0 refills | Status: AC | PRN

## 2024-05-09 NOTE — ED Notes (Signed)
 Pt sleeping at present, no distress noted.  Monitoring for safety.

## 2024-05-09 NOTE — ED Notes (Signed)
 Pt awake & resting at present, no distress noted, calm & cooperative at present.  Comfort measures given.  Monitoring for safety.

## 2024-05-09 NOTE — Discharge Summary (Signed)
 Kristin Ingram to be discharged Home per NP order. Discussed with the patient and all questions fully answered. An After Visit Summary was printed and given to the patient. All belongings returned. Patient escorted out and discharged home via private auto.  [name]  05/09/2024 5:27 PM

## 2024-05-09 NOTE — ED Notes (Signed)
 Paitent offered lunch trey. Paitent refused. Paitent has been given multiple bags of cheez-its and juice.

## 2024-05-09 NOTE — Discharge Instructions (Addendum)
Discharge to home in stable condition

## 2024-05-09 NOTE — ED Notes (Signed)
 Pt A&O x4, presents with passive, SI, no plan noted and depression.  Comfort measures given.  Monitoring for safety.

## 2024-05-09 NOTE — ED Provider Notes (Incomplete)
 Behavioral Health Progress Note  Date and Time: 05/09/2024 11:22 AM Name: Kristin Ingram MRN:  [id number]  Subjective:   Kristin Ingram is a 24 year old female with a history of Bipolar Disorder who presents voluntarily to [facility] with worsening depressive symptoms and suicidal ideation. patient reports she has been depressed all the time, with a significant intensification of symptoms today. She denies a specific acute trigger but reports a recent breakup with her girlfriend.  Patient endorses depressive symptoms including social isolation, poor sleep, frequent crying spells, feelings of hopelessness, and thoughts of suicide. Earlier today, patient states she felt unsafe and contacted law enforcement due to escalating emotional distress. patient's mother reports that patient had a loaded gun and was going to kill herself but contacted police for help. The firearm has since been secured and is currently in patient's mother's possession. patient denies any prior history of suicidal ideation or suicide attempts but reports 2 prior psychiatric hospitalizations related to mood instability.  Patient denies homicidal ideation, auditory or visual hallucinations, paranoia, or manic symptoms at this time. She denies alcohol or illicit substance use.    Today's Assessment Notes: Kristin Ingram presents alert , pleasant, corporative and oriented to person, place, time and situation. Speech is clear, coherent and with normal volume and pattern. Reports mood is stable and rates depression as a 3/10 and anxiety at 4/10, with 10 being high severity. Chart reviewed and findings shared with the treatment team and consulted attending psychiatrist. Appetite remained stable.  Reports sleeping over 6 hours last night and feeling restful.  Thought process coherent, logical, and organized.  Thought content without delusional thinking or paranoia.  Objectively not responding to internal stimuli.  She denies SI, HI, or  AVH, and no changes in medical condition at this time. She continues to refuse psychotropic medications for her MDD, Bipolar disorder in spite of education provided. Plan is to discharge patient at this time since she does not present harmful to self or others.  Diagnosis:  Final diagnoses:  Suicidal ideation  Bipolar disorder, current episode depressed, severe, without psychotic features (HCC)    Total Time spent with patient: 30 minutes  Past Psychiatric History: MDD, Bipolar disorder, and ADHD Past Medical History: See H&P Family History: See H&P Family Psychiatric  History: See H&P Social History: See H&P  Additional Social History:    Pain Medications: See MAR Prescriptions: See MAR Over the Counter: See MAR History of alcohol / drug use?: No history of alcohol / drug abuse Longest period of sobriety (when/how long): Pt denies any current etoh/ drug use Negative Consequences of Use:  (n/a) Withdrawal Symptoms:  (n/a)   Sleep: Good  Appetite:  Good  Current Medications:  Current Facility-Administered Medications  Medication Dose Route Frequency Provider Last Rate Last Admin   acetaminophen  (TYLENOL ) tablet 650 mg  650 mg Oral Q6H PRN Ajibola, Ene A, NP       alum & mag hydroxide-simeth (MAALOX/MYLANTA) 200-200-20 MG/5ML suspension 30 mL  30 mL Oral Q4H PRN Ajibola, Ene A, NP       haloperidol  (HALDOL ) tablet 5 mg  5 mg Oral TID PRN Ajibola, Ene A, NP       And   diphenhydrAMINE  (BENADRYL ) capsule 50 mg  50 mg Oral TID PRN Ajibola, Ene A, NP       haloperidol  lactate (HALDOL ) injection 5 mg  5 mg Intramuscular TID PRN Ajibola, Ene A, NP       And   diphenhydrAMINE  (BENADRYL ) injection 50 mg  50 mg Intramuscular TID PRN Ajibola, Ene A, NP       And   LORazepam  (ATIVAN ) injection 2 mg  2 mg Intramuscular TID PRN Ajibola, Ene A, NP       haloperidol  lactate (HALDOL ) injection 10 mg  10 mg Intramuscular TID PRN Ajibola, Ene A, NP       And   diphenhydrAMINE   (BENADRYL ) injection 50 mg  50 mg Intramuscular TID PRN Ajibola, Ene A, NP       And   LORazepam  (ATIVAN ) injection 2 mg  2 mg Intramuscular TID PRN Ajibola, Ene A, NP       hydrOXYzine  (ATARAX ) tablet 25 mg  25 mg Oral TID PRN Ajibola, Ene A, NP   25 mg at 05/08/24 2248   magnesium  hydroxide (MILK OF MAGNESIA) suspension 30 mL  30 mL Oral Daily PRN Ajibola, Ene A, NP       traZODone  (DESYREL ) tablet 50 mg  50 mg Oral QHS PRN Ajibola, Ene A, NP   50 mg at 05/08/24 2248   No current outpatient medications on file.   Labs  Lab Results:  Admission on 05/08/2024  Component Date Value Ref Range Status   WBC 05/08/2024 6.8  4.0 - 10.5 K/uL Final   RBC 05/08/2024 4.41  3.87 - 5.11 MIL/uL Final   Hemoglobin 05/08/2024 11.7 (L)  12.0 - 15.0 g/dL Final   HCT 87/85/7974 36.7  36.0 - 46.0 % Final   MCV 05/08/2024 83.2  80.0 - 100.0 fL Final   MCH 05/08/2024 26.5  26.0 - 34.0 pg Final   MCHC 05/08/2024 31.9  30.0 - 36.0 g/dL Final   RDW 87/85/7974 15.8 (H)  11.5 - 15.5 % Final   Platelets 05/08/2024 276  150 - 400 K/uL Final   nRBC 05/08/2024 0.0  0.0 - 0.2 % Final   Neutrophils Relative % 05/08/2024 65  % Final   Neutro Abs 05/08/2024 4.4  1.7 - 7.7 K/uL Final   Lymphocytes Relative 05/08/2024 28  % Final   Lymphs Abs 05/08/2024 1.9  0.7 - 4.0 K/uL Final   Monocytes Relative 05/08/2024 7  % Final   Monocytes Absolute 05/08/2024 0.5  0.1 - 1.0 K/uL Final   Eosinophils Relative 05/08/2024 0  % Final   Eosinophils Absolute 05/08/2024 0.0  0.0 - 0.5 K/uL Final   Basophils Relative 05/08/2024 0  % Final   Basophils Absolute 05/08/2024 0.0  0.0 - 0.1 K/uL Final   Immature Granulocytes 05/08/2024 0  % Final   Abs Immature Granulocytes 05/08/2024 0.01  0.00 - 0.07 K/uL Final   Performed at [facility], [address] [place], [place] [address]   Sodium 05/08/2024 138  135 - 145 mmol/L Final   Potassium 05/08/2024 3.9  3.5 - 5.1 mmol/L Final   Chloride 05/08/2024  101  98 - 111 mmol/L Final   CO2 05/08/2024 25  22 - 32 mmol/L Final   Glucose, Bld 05/08/2024 88  70 - 99 mg/dL Final   Glucose reference range applies only to samples taken after fasting for at least 8 hours.   BUN 05/08/2024 11  6 - 20 mg/dL Final   Creatinine, Ser 05/08/2024 0.73  0.44 - 1.00 mg/dL Final   Calcium 87/85/7974 9.3  8.9 - 10.3 mg/dL Final   Total Protein 87/85/7974 8.1  6.5 - 8.1 g/dL Final   Albumin 87/85/7974 4.3  3.5 - 5.0 g/dL Final   AST 87/85/7974 23  15 - 41  U/L Final   ALT 05/08/2024 16  0 - 44 U/L Final   Alkaline Phosphatase 05/08/2024 45  38 - 126 U/L Final   Total Bilirubin 05/08/2024 1.3 (H)  0.0 - 1.2 mg/dL Final   GFR, Estimated 05/08/2024 >60  >60 mL/min Final   Comment: (NOTE) Calculated using the CKD-EPI Creatinine Equation (2021)    Anion gap 05/08/2024 12  5 - 15 Final   Performed at [facility], [address], [place], [place] [address]   Hgb A1c MFr Bld 05/08/2024 5.1  4.8 - 5.6 % Final   Comment: (NOTE) Diagnosis of Diabetes The following HbA1c ranges recommended by the American Diabetes Association (ADA) may be used as an aid in the diagnosis of diabetes mellitus.  Hemoglobin             Suggested A1C NGSP%              Diagnosis  <5.7                   Non Diabetic  5.7-6.4                Pre-Diabetic  >6.4                   Diabetic  <7.0                   Glycemic control for                       adults with diabetes.     Mean Plasma Glucose 05/08/2024 99.67  mg/dL Final   Performed at [facility], [address] [place], [place] [address]   Alcohol, Ethyl (B) 05/08/2024 <15  <15 mg/dL Final   Comment: (NOTE) For medical purposes only. Performed at [facility], [address] [place], [place] [address]    Cholesterol 05/08/2024 206 (H)  0 - 200 mg/dL Final   Triglycerides 87/85/7974 63  <150 mg/dL Final   HDL 87/85/7974 66  >40 mg/dL Final   Total CHOL/HDL Ratio 05/08/2024 3.1   RATIO Final   VLDL 05/08/2024 13  0 - 40 mg/dL Final   LDL Cholesterol 05/08/2024 127 (H)  0 - 99 mg/dL Final   Comment:        Total Cholesterol/HDL:CHD Risk Coronary Heart Disease Risk Table                     Men   Women  1/2 Average Risk   3.4   3.3  Average Risk       5.0   4.4  2 X Average Risk   9.6   7.1  3 X Average Risk  23.4   11.0        Use the calculated Patient Ratio above and the CHD Risk Table to determine the patient's CHD Risk.        ATP III CLASSIFICATION (LDL):  <100     mg/dL   Optimal  899-870  mg/dL   Near or Above                    Optimal  130-159  mg/dL   Borderline  839-810  mg/dL   High  >809     mg/dL   Very High Performed at [facility], [address] [place], [place] [address]    TSH 05/08/2024 0.811  0.350 - 4.500 uIU/mL Final   Comment: Performed by a 3rd Generation assay with a functional sensitivity of <=0.01 uIU/mL. Performed at [facility], [address] [place], [place] [address]    Blood Alcohol level:  Lab Results  Component Value Date   [facility] <15 05/08/2024   ETH <10 06/22/2021   Metabolic Disorder Labs: Lab Results  Component Value Date   HGBA1C 5.1 05/08/2024   MPG 99.67 05/08/2024   No results found for: PROLACTIN Lab Results  Component Value Date   CHOL 206 (H) 05/08/2024   TRIG 63 05/08/2024   HDL 66 05/08/2024   CHOLHDL 3.1 05/08/2024   VLDL 13 05/08/2024   LDLCALC 127 (H) 05/08/2024   Therapeutic Lab Levels: No results found for: LITHIUM No results found for: VALPROATE No results found for: CBMZ  Physical Findings   PHQ2-9    Flowsheet Row ED from 03/01/2020 in [facility]  PHQ-2 Total Score 3  PHQ-9 Total Score 8   Flowsheet Row ED from 05/08/2024 in [facility] ED from 10/08/2022 in [facility] Department at [facility] UC from 10/27/2021 in [facility] RISK CATEGORY  No Risk No Risk No Risk   Musculoskeletal  Strength & Muscle Tone: within normal limits Gait & Station: normal Patient leans: N/A  Psychiatric Specialty Exam  Presentation  General Appearance:  Appropriate for Environment  Eye Contact: Good  Speech: Clear and Coherent  Speech Volume: Normal  Handedness: Right  Mood and Affect  Mood: Depressed  Affect: Congruent  Thought Process  Thought Processes: Coherent  Descriptions of Associations:Intact  Orientation:Full (Time, Place and Person)  Thought Content:WDL  Diagnosis of Schizophrenia or Schizoaffective disorder in past: No    Hallucinations:Hallucinations: None  Ideas of Reference:None  Suicidal Thoughts:Suicidal Thoughts: No  Homicidal Thoughts:Homicidal Thoughts: No   Sensorium  Memory: Immediate Good; Recent Good; Remote Good  Judgment: Poor  Insight: Fair  Art Therapist  Concentration: Fair  Attention Span: Good  Recall: Good  Fund of Knowledge: Good  Language: Good  Psychomotor Activity  Psychomotor Activity: Psychomotor Activity: Normal  Assets  Assets: Communication Skills; Desire for Improvement; Housing; Physical Health; Social Support  Sleep  Sleep: Sleep: Fair  No Safety Checks orders active in given range  Nutritional Assessment (For OBS and [facility] admissions only) Has the patient had a weight loss or gain of 10 pounds or more in the last 3 months?: No Has the patient had a decrease in food intake/or appetite?: Yes Does the patient have dental problems?: No Does the patient have eating habits or behaviors that may be indicators of an eating disorder including binging or inducing vomiting?: No Has the patient recently lost weight without trying?: 0 Has the patient been eating poorly because of a decreased appetite?: 0 Malnutrition Screening Tool Score: 0    Physical Exam  Physical Exam Vitals and nursing note reviewed.  Constitutional:       Appearance: She is normal weight.  HENT:     Head: Normocephalic.     Right Ear: External ear normal.     Left Ear: External ear normal.     Nose: Nose normal.     Mouth/Throat:     Mouth: Mucous membranes are moist.     Pharynx: Oropharynx is clear.  Eyes:     Extraocular Movements: Extraocular movements intact.  Cardiovascular:     Rate and Rhythm: Normal rate.  Pulses: Normal pulses.  Pulmonary:     Effort: No respiratory distress.  Musculoskeletal:        General: Normal range of motion.     Cervical back: Normal range of motion.  Skin:    General: Skin is dry.  Neurological:     General: No focal deficit present.     Mental Status: She is alert and oriented to person, place, and time.  Psychiatric:        Mood and Affect: Mood normal.        Behavior: Behavior normal.    Review of Systems  Constitutional:  Negative for chills and fever.  HENT:  Negative for sore throat.   Eyes:  Negative for blurred vision.  Respiratory:  Negative for cough, sputum production, shortness of breath and wheezing.   Cardiovascular:  Negative for chest pain and palpitations.  Gastrointestinal:  Negative for abdominal pain, constipation, diarrhea, heartburn, nausea and vomiting.  Genitourinary:  Negative for dysuria.  Musculoskeletal:  Negative for falls.  Skin:  Negative for itching and rash.  Neurological:  Negative for dizziness and headaches.  Endo/Heme/Allergies: Negative.   Psychiatric/Behavioral:  Positive for depression. Negative for hallucinations, substance abuse and suicidal ideas. The patient is nervous/anxious. The patient does not have insomnia.    Blood pressure 113/69, pulse 72, temperature 98.6 F (37 C), temperature source Oral, resp. rate 16, SpO2 100%. There is no height or weight on file to calculate BMI.  Treatment Plan Summary: Daily contact with patient to assess and evaluate symptoms and progress in treatment and Medication management  Patient denies SI, HI,  AVH and does not have any emergency medical condition. She continues to refuse psychotropic medications for her MDD, Bipolar disorder in spite of education provided. Plan is to discharge patient at this time since she does not present harmful to self or others.  Kristin JAYSON Azure, FNP 05/09/2024 11:22 AM

## 2024-05-09 NOTE — Progress Notes (Signed)
Pt is awake, alert and oriented X4. Pt did not voice any complaints of pain or discomfort. No signs of acute distress noted. Pt denies current SI/HI/AVH, plan or intent. Staff will monitor for pt's safety. 

## 2024-05-09 NOTE — Progress Notes (Signed)
°   05/08/24 1910  [facility] Triage Screening (Walk-ins at [facility] only)  How Did You Hear About Us ? Legal System  What Is the Reason for Your Visit/Call Today? Pt was brought to [facility] voluntarily by [employer]. Pt reports that she contacted the police and stated that she wanted to kill herself. Pt reports that she started feeling suicidal today. Pt denies a plan. Pt reports a MH diagnosis of Bipolar. Pt denies outpatient services and medication management. Pt denies HI, AVH, drug/etoh use.  How Long Has This Been Causing You Problems? <Week  Have You Recently Had Any Thoughts About Hurting Yourself? Yes  How long ago did you have thoughts about hurting yourself? today  Are You Planning to Commit Suicide/Harm Yourself At This time? No  Have you Recently Had Thoughts About Hurting Someone [name]? No  Are You Planning To Harm Someone At This Time? No  Explanation: Denies HI  Physical Abuse Denies  Verbal Abuse Denies  Sexual Abuse Denies  Exploitation of patient/patient's resources Denies  Self-Neglect Denies  Possible abuse reported to:  (n/a)  Are you currently experiencing any auditory, visual or other hallucinations? No  Have You Used Any Alcohol or Drugs in the Past 24 Hours? No  Do you have any current medical co-morbidities that require immediate attention? No  Clinician description of patient physical appearance/behavior: Pt appears to be depressed with flat affect. Pt is cooperative.  What Do You Feel Would Help You the Most Today? Treatment for Depression or other mood problem;Stress Management;Medication(s)  If access to [facility] was not available, would you have sought care in the Emergency Department? Yes  Determination of Need Urgent (48 hours)  Options For Referral Inpatient Hospitalization;Outpatient Therapy;Medication Management  Determination of Need filed? Yes    Flowsheet Row ED from 05/08/2024 in [facility] ED from 10/08/2022 in [facility] Emergency  Department at [facility] UC from 10/27/2021 in [facility] at [facility] RISK CATEGORY No Risk No Risk No Risk

## 2024-05-11 ENCOUNTER — Encounter (HOSPITAL_COMMUNITY): Payer: Self-pay | Admitting: Physician Assistant

## 2024-05-11 ENCOUNTER — Ambulatory Visit (HOSPITAL_COMMUNITY): Admitting: Clinical

## 2024-05-11 ENCOUNTER — Telehealth (HOSPITAL_COMMUNITY): Payer: Self-pay | Admitting: Professional

## 2024-05-11 ENCOUNTER — Ambulatory Visit (INDEPENDENT_AMBULATORY_CARE_PROVIDER_SITE_OTHER): Admitting: Physician Assistant

## 2024-05-11 VITALS — BP 117/68 | HR 63 | Temp 98.0°F | Wt 131.2 lb

## 2024-05-11 DIAGNOSIS — F3132 Bipolar disorder, current episode depressed, moderate: Secondary | ICD-10-CM

## 2024-05-11 MED ORDER — ARIPIPRAZOLE ER 400 MG IM PRSY: 400.0000 mg | PREFILLED_SYRINGE | INTRAMUSCULAR | 11 refills | Status: DC

## 2024-05-11 MED ORDER — ARIPIPRAZOLE 5 MG PO TABS: 5.0000 mg | ORAL_TABLET | Freq: Every day | ORAL | 0 refills | Status: DC

## 2024-05-11 MED ORDER — ARIPIPRAZOLE 5 MG PO TABS: 5.0000 mg | ORAL_TABLET | Freq: Every day | ORAL | 0 refills | Status: AC

## 2024-05-11 NOTE — Telephone Encounter (Signed)
 See call log
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
## 2024-05-13 ENCOUNTER — Telehealth (HOSPITAL_COMMUNITY): Payer: Self-pay | Admitting: Professional

## 2024-05-13 NOTE — Telephone Encounter (Signed)
 See call log

## 2024-05-17 ENCOUNTER — Telehealth (HOSPITAL_COMMUNITY): Payer: Self-pay | Admitting: Licensed Clinical Social Worker

## 2024-05-17 NOTE — Telephone Encounter (Signed)
 1:59 pm: Pt's mother called to discuss PHP intake. Cln explained that due to HIPAA, we have to speak to the patient to schedule appointments and discuss any details. Pt's mother stated she signed a DPR and asked what it covers. Cln stated that this cln is unsure of the entire scope of the DPR, but that we must speak to the patient to schedule. Pt's mother asked for cln's name and then ended the call.  2:02 pm: pt's mother called back and stated pt was present. Cln asked pt to confirm her identity with her full name and DOB before proceeding. Cln asked pt to confirm her phone number due to difficulty reaching pt directly. Pt reported the phone number on file is correct [phone]). Cln oriented pt to PHP and pt agreed to assessment. Cln explained that the assessment does not guarantee admission to [facility] and that the purpose of the assessment is to demonstrate medical necessity. Cln explained that in reviewing pt's reported depression and anxiety scores (PHQ and GAD 7), it may be that she does not meet criteria for PHP, as PHP patients usually have higher scores. Cln agreed to schedule assessment as discussed. Pt agreeable and provided email address for MyChart activation, which cln explained is used for her assessment. Cln reiterated that the assessment is virtual while the program is in-person. Pt verbalized understanding and denied SI.

## 2024-05-17 NOTE — Telephone Encounter (Signed)
 See call log

## 2024-05-25 ENCOUNTER — Encounter (HOSPITAL_COMMUNITY): Payer: Self-pay

## 2024-05-25 ENCOUNTER — Other Ambulatory Visit (HOSPITAL_COMMUNITY): Payer: Self-pay

## 2024-05-25 ENCOUNTER — Telehealth (HOSPITAL_COMMUNITY): Payer: Self-pay | Admitting: Professional

## 2024-05-25 ENCOUNTER — Ambulatory Visit (HOSPITAL_COMMUNITY)

## 2024-05-25 ENCOUNTER — Ambulatory Visit (INDEPENDENT_AMBULATORY_CARE_PROVIDER_SITE_OTHER)

## 2024-05-25 VITALS — BP 117/62 | HR 100 | Temp 98.5°F | Ht 64.0 in | Wt 126.8 lb

## 2024-05-25 DIAGNOSIS — F3132 Bipolar disorder, current episode depressed, moderate: Secondary | ICD-10-CM

## 2024-05-25 MED ORDER — ARIPIPRAZOLE ER 400 MG IM PRSY: 400.0000 mg | PREFILLED_SYRINGE | INTRAMUSCULAR | 11 refills | Status: AC

## 2024-05-25 MED ORDER — ARIPIPRAZOLE ER 400 MG IM PRSY
400.0000 mg | PREFILLED_SYRINGE | INTRAMUSCULAR | Status: AC
  Administered 2024-05-25 – 2024-06-23 (×2): 400 mg via INTRAMUSCULAR

## 2024-05-25 NOTE — Telephone Encounter (Signed)
"  See call log  "

## 2024-05-25 NOTE — Progress Notes (Cosign Needed)
 Patient arrived today for her first injection of Abilify  Maintenna 400 mg. Patient presents well groomed with an appropriate affect. Patient Patient verified that she has been taking the oral meds. Patient had no questions. Injection was prepared as ordered and administered in patients RUOQ. Patient tolerated well and without complaint and will return in 28 days.    NDC: 40851-927-19 LOT: [id number] EXP: MAR 2028

## 2024-06-01 ENCOUNTER — Encounter (HOSPITAL_COMMUNITY): Payer: Self-pay

## 2024-06-01 ENCOUNTER — Ambulatory Visit (INDEPENDENT_AMBULATORY_CARE_PROVIDER_SITE_OTHER)

## 2024-06-01 DIAGNOSIS — F3132 Bipolar disorder, current episode depressed, moderate: Secondary | ICD-10-CM | POA: Diagnosis not present
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
## 2024-06-08 ENCOUNTER — Telehealth (HOSPITAL_COMMUNITY): Payer: Self-pay

## 2024-06-08 ENCOUNTER — Ambulatory Visit (HOSPITAL_COMMUNITY)

## 2024-06-08 NOTE — Telephone Encounter (Signed)
 I called twice. The first time someone answered and the hung up. The second time I left a voicemail.

## 2024-06-09 NOTE — Telephone Encounter (Signed)
 She was a no show. I called and left a voicemail

## 2024-06-15 ENCOUNTER — Ambulatory Visit (INDEPENDENT_AMBULATORY_CARE_PROVIDER_SITE_OTHER)

## 2024-06-15 ENCOUNTER — Encounter (HOSPITAL_COMMUNITY): Payer: Self-pay

## 2024-06-15 ENCOUNTER — Telehealth (HOSPITAL_COMMUNITY): Payer: Self-pay

## 2024-06-15 DIAGNOSIS — F3132 Bipolar disorder, current episode depressed, moderate: Secondary | ICD-10-CM | POA: Diagnosis not present
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
## 2024-06-23 ENCOUNTER — Encounter (HOSPITAL_COMMUNITY): Payer: Self-pay

## 2024-06-23 ENCOUNTER — Ambulatory Visit (INDEPENDENT_AMBULATORY_CARE_PROVIDER_SITE_OTHER)

## 2024-06-23 ENCOUNTER — Ambulatory Visit (HOSPITAL_COMMUNITY): Admitting: Psychiatry

## 2024-06-23 VITALS — BP 126/65 | HR 72 | Wt 129.2 lb

## 2024-06-23 DIAGNOSIS — F200 Paranoid schizophrenia: Secondary | ICD-10-CM

## 2024-06-23 DIAGNOSIS — F3161 Bipolar disorder, current episode mixed, mild: Secondary | ICD-10-CM

## 2024-06-23 DIAGNOSIS — F411 Generalized anxiety disorder: Secondary | ICD-10-CM

## 2024-06-23 NOTE — Progress Notes (Signed)
 Patient arrived today for her first injection of Abilify  Maintenna 400 mg. Patient presents well groomed with an appropriate affect. Patient Patient verified that she has been taking the oral meds. Patient had no questions. Injection was prepared as ordered and administered in patients RUOQ Per pt request . Patient tolerated well and without complaint and will return in 28 days.

## 2024-06-29 ENCOUNTER — Telehealth (HOSPITAL_COMMUNITY): Payer: Self-pay

## 2024-06-29 ENCOUNTER — Ambulatory Visit (HOSPITAL_COMMUNITY)

## 2024-06-29 NOTE — Telephone Encounter (Signed)
 Patient called in stated she has been experiencing Akathisia from Abilify  Maintena 400 mg for a month now. Patient would like communication with provider to determine next steps.

## 2024-06-30 ENCOUNTER — Other Ambulatory Visit (HOSPITAL_COMMUNITY): Payer: Self-pay | Admitting: Psychiatry

## 2024-06-30 DIAGNOSIS — G2571 Drug induced akathisia: Secondary | ICD-10-CM

## 2024-06-30 MED ORDER — PROPRANOLOL HCL 10 MG PO TABS: 10.0000 mg | ORAL_TABLET | Freq: Two times a day (BID) | ORAL | 1 refills | Status: AC

## 2024-06-30 NOTE — Progress Notes (Signed)
 Discussed with patient. Mom is also on the phone call. Patient reports trouble falling asleep due to an urge to get up and feeling like she needs to get up and move as well as when she is sitting down. She also reports another symptom as being immediately tired when she gets into the car. The restlessness sounded like akathisia. We discussed the history of her asthma and mom states patient has not had asthma attack since she was young. We discussed the risks (increase in asthma attacks), benefits (treatment of akathisia), and side effects (hypotension, dizziness) of starting propranolol  and shared decision making to trial propranolol  for her symptoms.

## 2024-07-21 ENCOUNTER — Ambulatory Visit (HOSPITAL_COMMUNITY)

## 2024-08-18 ENCOUNTER — Encounter (HOSPITAL_COMMUNITY): Admitting: Psychiatry

## 2024-08-18 ENCOUNTER — Ambulatory Visit (HOSPITAL_COMMUNITY)

## 2025-03-08 ENCOUNTER — Ambulatory Visit: Admitting: Physician Assistant
